# Patient Record
Sex: Female | Born: 1965
Health system: Southern US, Community
[De-identification: ages and names within clinical notes are randomized; demographics above are authoritative.]

## PROBLEM LIST (undated history)

## (undated) DIAGNOSIS — E669 Obesity, unspecified: Secondary | ICD-10-CM

## (undated) DIAGNOSIS — U071 COVID-19: Secondary | ICD-10-CM

## (undated) DIAGNOSIS — E7800 Pure hypercholesterolemia, unspecified: Secondary | ICD-10-CM

## (undated) DIAGNOSIS — E785 Hyperlipidemia, unspecified: Secondary | ICD-10-CM

## (undated) DIAGNOSIS — M549 Dorsalgia, unspecified: Secondary | ICD-10-CM

## (undated) DIAGNOSIS — O009 Unspecified ectopic pregnancy without intrauterine pregnancy: Secondary | ICD-10-CM

## (undated) DIAGNOSIS — K5900 Constipation, unspecified: Secondary | ICD-10-CM

## (undated) DIAGNOSIS — G8929 Other chronic pain: Secondary | ICD-10-CM

## (undated) DIAGNOSIS — E559 Vitamin D deficiency, unspecified: Principal | ICD-10-CM

## (undated) DIAGNOSIS — N7011 Chronic salpingitis: Secondary | ICD-10-CM

## (undated) DIAGNOSIS — R102 Pelvic and perineal pain: Secondary | ICD-10-CM

## (undated) DIAGNOSIS — D259 Leiomyoma of uterus, unspecified: Secondary | ICD-10-CM

## (undated) HISTORY — PX: WISDOM TOOTH EXTRACTION: SHX21

## (undated) HISTORY — PX: ABLATION: SHX5711

## (undated) HISTORY — DX: COVID-19: U07.1

## (undated) HISTORY — PX: APPENDECTOMY: SHX54

## (undated) HISTORY — DX: Vitamin D deficiency, unspecified: E55.9

## (undated) HISTORY — DX: Obesity, unspecified: E66.9

---

## 1996-03-25 HISTORY — PX: SALPINGECTOMY: SHX328

## 2011-10-16 ENCOUNTER — Encounter (HOSPITAL_COMMUNITY): Payer: Self-pay | Admitting: Emergency Medicine

## 2011-10-16 ENCOUNTER — Emergency Department (HOSPITAL_COMMUNITY)
Admission: EM | Admit: 2011-10-16 | Discharge: 2011-10-16 | Disposition: A | Discharge status: Home / Self Care | Payer: Self-pay | Attending: Emergency Medicine | Admitting: Emergency Medicine

## 2011-10-16 DIAGNOSIS — X500XXA Overexertion from strenuous movement or load, initial encounter: Secondary | ICD-10-CM | POA: Insufficient documentation

## 2011-10-16 DIAGNOSIS — S139XXA Sprain of joints and ligaments of unspecified parts of neck, initial encounter: Secondary | ICD-10-CM | POA: Insufficient documentation

## 2011-10-16 DIAGNOSIS — Y9269 Other specified industrial and construction area as the place of occurrence of the external cause: Secondary | ICD-10-CM | POA: Insufficient documentation

## 2011-10-16 DIAGNOSIS — IMO0002 Reserved for concepts with insufficient information to code with codable children: Secondary | ICD-10-CM

## 2011-10-16 HISTORY — DX: Hyperlipidemia, unspecified: E78.5

## 2011-10-16 HISTORY — DX: Leiomyoma of uterus, unspecified: D25.9

## 2011-10-16 MED ORDER — KETOROLAC TROMETHAMINE 60 MG/2ML IM SOLN
60.0000 mg | Freq: Once | INTRAMUSCULAR | Status: AC
  Administered 2011-10-16: 60 mg via INTRAMUSCULAR
  Filled 2011-10-16: qty 2

## 2011-10-16 MED ORDER — CYCLOBENZAPRINE HCL 10 MG PO TABS: 10.0000 mg | ORAL_TABLET | Freq: Two times a day (BID) | ORAL | Status: AC | PRN

## 2011-10-16 MED ORDER — IBUPROFEN 600 MG PO TABS: 600.0000 mg | ORAL_TABLET | Freq: Four times a day (QID) | ORAL | Status: AC | PRN

## 2011-10-16 NOTE — ED Notes (Signed)
Pt reports upper back and neck pain that increases with movement denies event or injury

## 2011-10-16 NOTE — ED Provider Notes (Signed)
History     CSN: [id number]  Arrival date & time 10/16/11  2952   First MD Initiated Contact with Patient 10/16/11 [phone]      Chief Complaint  Patient presents with  . Neck Pain  . Back Pain    (Consider location/radiation/quality/duration/timing/severity/associated sxs/prior treatment) HPI Comments: 46 y/o female here with sudden onset neck pain while driving yesterday. No known injury or trauma. States she works in [employer] and is constantly lifting heavy boxes. Describes pain as sharp, constant, and rates it 10/10. Pain worse with movement and tylenol provides no relief. She had a hard time getting comfortable to sleep last night. Denies any neck stiffness, fever, chills, rashes, numbness or tingling down arms, extremity weakness, headache or visual disturbance. She has never injured her neck before.  Patient is a 46 y.o. female presenting with neck pain and back pain. The history is provided by the patient.  Neck Pain  Pertinent negatives include no numbness and no headaches.  Back Pain  Pertinent negatives include no fever, no numbness and no headaches.    Past Medical History  Diagnosis Date  . Fibroid uterus   . Hyperlipemia     No past surgical history on file.  No family history on file.  History  Substance Use Topics  . Smoking status: Never Smoker   . Smokeless tobacco: Not on file  . Alcohol Use: No    OB History    Grav Para Term Preterm Abortions TAB SAB Ect Mult Living                  Review of Systems  Constitutional: Negative for fever and chills.  HENT: Positive for neck pain. Negative for neck stiffness.   Eyes: Negative for visual disturbance.  Musculoskeletal:       Neck pain  Skin: Negative for rash.  Neurological: Negative for numbness and headaches.    Allergies  Review of patient's allergies indicates no known allergies.  Home Medications   Current Outpatient Rx  Name Route Sig Dispense Refill  . ACETAMINOPHEN 325 MG PO TABS  Oral Take 650 mg by mouth every 6 (six) hours as needed. For pain    . CYCLOBENZAPRINE HCL 10 MG PO TABS Oral Take 1 tablet (10 mg total) by mouth 2 (two) times daily as needed for muscle spasms. 20 tablet 0  . IBUPROFEN 600 MG PO TABS Oral Take 1 tablet (600 mg total) by mouth every 6 (six) hours as needed for pain. 30 tablet 0    BP 156/84  Pulse 68  Temp 97.8 F (36.6 C) (Oral)  Resp 18  SpO2 99%  Physical Exam  Constitutional: She is oriented to person, place, and time. She appears well-developed and well-nourished.  HENT:  Head: Normocephalic and atraumatic.  Mouth/Throat: Oropharynx is clear and moist.  Eyes: Conjunctivae and EOM are normal. Pupils are equal, round, and reactive to light.  Neck: Muscular tenderness (left paraspinal muscles and trapezius tenderness) present. No spinous process tenderness present. No rigidity. Decreased range of motion (in all directions due to pain) present. No edema and no erythema present. No Brudzinski's sign and no Kernig's sign noted.  Cardiovascular: Normal rate, regular rhythm, normal heart sounds and intact distal pulses.   Pulmonary/Chest: Effort normal and breath sounds normal.  Musculoskeletal:       Cervical back: She exhibits decreased range of motion and tenderness (left paraspinal muscles and trapezius). She exhibits no bony tenderness, no edema and no deformity.  Neurological:  She is alert and oriented to person, place, and time. She has normal strength and normal reflexes. No sensory deficit.       DTR's UE intact and symmetric  Skin: No rash noted.  Psychiatric: She has a normal mood and affect. Her behavior is normal.    ED Course  Procedures (including critical care time)  Labs Reviewed - No data to display No results found.   1. Neck sprain and strain       MDM  46 y/o female with neck pain. Patient is afebrile with negative meningeal signs. No focal neurologic deficits present. Exam consistent with muscle  strain/sprain. Discharge with ibuprofen and flexeril, recommend ice/heat and rest. Patient comfortable with plan of care.  Trevor Mace, PA-C 10/16/11 [phone]

## 2011-10-16 NOTE — ED Provider Notes (Signed)
Medical screening examination/treatment/procedure(s) were performed by non-physician practitioner and as supervising physician I was immediately available for consultation/collaboration.  Vivi Piccirilli, MD 10/16/11 0723 

## 2011-10-17 ENCOUNTER — Ambulatory Visit (INDEPENDENT_AMBULATORY_CARE_PROVIDER_SITE_OTHER): Payer: Self-pay | Admitting: Obstetrics & Gynecology

## 2011-10-17 ENCOUNTER — Encounter: Payer: Self-pay | Admitting: Obstetrics & Gynecology

## 2011-10-17 VITALS — BP 128/83 | HR 60 | Temp 97.5°F | Ht 62.0 in | Wt 159.0 lb

## 2011-10-17 DIAGNOSIS — N920 Excessive and frequent menstruation with regular cycle: Secondary | ICD-10-CM | POA: Insufficient documentation

## 2011-10-17 DIAGNOSIS — D219 Benign neoplasm of connective and other soft tissue, unspecified: Secondary | ICD-10-CM

## 2011-10-17 DIAGNOSIS — D259 Leiomyoma of uterus, unspecified: Secondary | ICD-10-CM

## 2011-10-17 MED ORDER — NORGESTIMATE-ETH ESTRADIOL 0.25-35 MG-MCG PO TABS: 1.0000 | ORAL_TABLET | Freq: Every day | ORAL | Status: DC

## 2011-10-17 NOTE — Patient Instructions (Signed)
Fibroids You have been diagnosed as having a fibroid. Fibroids are smooth muscle lumps (tumors) which can occur any place in a woman's body. They are usually in the womb (uterus). The most common problem (symptom) of fibroids is bleeding. Over time this may cause low red blood cells (anemia). Other symptoms include feelings of pressure and pain in the pelvis. The diagnosis (learning what is wrong) of fibroids is made by physical exam. Sometimes tests such as an ultrasound are used. This is helpful when fibroids are felt around the ovaries and to look for tumors. TREATMENT   Most fibroids do not need surgical or medical treatment. Sometimes a tissue sample (biopsy) of the lining of the uterus is done to rule out cancer. If there is no cancer and only a small amount of bleeding, the problem can be watched.   Hormonal treatment can improve the problem.   When surgery is needed, it can consist of removing the fibroid. Vaginal birth may not be possible after the removal of fibroids. This depends on where they are and the extent of surgery. When pregnancy occurs with fibroids it is usually normal.   Your caregiver can help decide which treatments are best for you.  HOME CARE INSTRUCTIONS   Do not use aspirin as this may increase bleeding problems.   If your periods (menses) are heavy, record the number of pads or tampons used per month. Bring this information to your caregiver. This can help them determine the best treatment for you.  SEEK IMMEDIATE MEDICAL CARE IF:  You have pelvic pain or cramps not controlled with medications, or experience a sudden increase in pain.   You have an increase of pelvic bleeding between and during menses.   You feel lightheaded or have fainting spells.   You develop worsening belly (abdominal) pain.  Document Released: 03/08/2000 Document Revised: 02/28/2011 Document Reviewed: 10/29/2007 [facility] Patient Information 2012 [place], [place].Menorrhagia Dysfunctional  uterine bleeding is different from a normal menstrual period. When periods are heavy or there is more bleeding than is usual for you, it is called menorrhagia. It may be caused by hormonal imbalance, or physical, metabolic, or other problems. Examination is necessary in order that your caregiver may treat treatable causes. If this is a continuing problem, a D&C may be needed. That means that the cervix (the opening of the uterus or womb) is dilated (stretched larger) and the lining of the uterus is scraped out. The tissue scraped out is then examined under a microscope by a specialist (pathologist) to make sure there is nothing of concern that needs further or more extensive treatment. HOME CARE INSTRUCTIONS   If medications were prescribed, take exactly as directed. Do not change or switch medications without consulting your caregiver.   Long term heavy bleeding may result in iron deficiency. Your caregiver may have prescribed iron pills. They help replace the iron your body lost from heavy bleeding. Take exactly as directed. Iron may cause constipation. If this becomes a problem, increase the bran, fruits, and roughage in your diet.   Do not take aspirin or medicines that contain aspirin one week before or during your menstrual period. Aspirin may make the bleeding worse.   If you need to change your sanitary pad or tampon more than once every 2 hours, stay in bed and rest as much as possible until the bleeding stops.   Eat well-balanced meals. Eat foods high in iron. Examples are leafy green vegetables, meat, liver, eggs, and whole grain breads and  cereals. Do not try to lose weight until the abnormal bleeding has stopped and your blood iron level is back to normal.  SEEK MEDICAL CARE IF:   You need to change your sanitary pad or tampon more than once an hour.   You develop nausea (feeling sick to your stomach) and vomiting, dizziness, or diarrhea while you are taking your medicine.   You have  any problems that may be related to the medicine you are taking.  SEEK IMMEDIATE MEDICAL CARE IF:   You have a fever.   You develop chills.   You develop severe bleeding or start to pass blood clots.   You feel dizzy or faint.  MAKE SURE YOU:   Understand these instructions.   Will watch your condition.   Will get help right away if you are not doing well or get worse.  Document Released: 03/11/2005 Document Revised: 02/28/2011 Document Reviewed: 10/30/2007 [facility] Patient Information 2012 [place], [place].

## 2011-10-17 NOTE — Progress Notes (Signed)
Subjective:     Patient ID: Jill Vargas, female   DOB: 1965-12-29, 46 y.o.   MRN: [id number]  HPI [id number] LMP 10/09/11. Pt with h/o menorrhagia.  Treated when incarcerated with medicine that stopped her cycle for 9months.  Cycle has now returned heavy and prolonged and assocaited with large clots.  No pain noted.  Prev dx'd with uterine fibroids.  Pt reports that in the past year she had a Endo bx and a PA that were normal.  She has the results at home.  Not currently sexually active.   Review of Systems [place]     Objective:   Physical Exam GU: EGBUS: no lesions Vagina: + blood in vault Cervix: no lesion; no mucopurulent d/c Uterus:enlarged ~14 weeks sized, mobile with good decensus Adnexa: no masses         Assessment:   menorrhagia thought 2ndary to uterine fibroids.    Plan:     Sprintec 1 po q day F/u 3 months or sooner prn Pt reports that she will bring in all of her records later on today  Aliece Honold L. Harraway-Smith, M.D., [place]

## 2012-01-02 ENCOUNTER — Encounter (HOSPITAL_COMMUNITY): Payer: Self-pay | Admitting: Emergency Medicine

## 2012-01-02 ENCOUNTER — Emergency Department (HOSPITAL_COMMUNITY)
Admission: EM | Admit: 2012-01-02 | Discharge: 2012-01-02 | Disposition: A | Discharge status: Home / Self Care | Payer: Self-pay | Attending: Emergency Medicine | Admitting: Emergency Medicine

## 2012-01-02 DIAGNOSIS — D259 Leiomyoma of uterus, unspecified: Secondary | ICD-10-CM | POA: Insufficient documentation

## 2012-01-02 DIAGNOSIS — D219 Benign neoplasm of connective and other soft tissue, unspecified: Secondary | ICD-10-CM

## 2012-01-02 DIAGNOSIS — N920 Excessive and frequent menstruation with regular cycle: Secondary | ICD-10-CM | POA: Insufficient documentation

## 2012-01-02 LAB — WET PREP, GENITAL
Clue Cells Wet Prep HPF POC: NONE SEEN
Trich, Wet Prep: NONE SEEN
Yeast Wet Prep HPF POC: NONE SEEN

## 2012-01-02 LAB — URINALYSIS, ROUTINE W REFLEX MICROSCOPIC
Glucose, UA: NEGATIVE mg/dL
Protein, ur: 30 mg/dL — AB

## 2012-01-02 LAB — COMPREHENSIVE METABOLIC PANEL
AST: 13 U/L (ref 0–37)
BUN: 10 mg/dL (ref 6–23)
CO2: 25 mEq/L (ref 19–32)
Calcium: 8.8 mg/dL (ref 8.4–10.5)
Creatinine, Ser: 0.68 mg/dL (ref 0.50–1.10)
GFR calc non Af Amer: >90 mL/min (ref >90)

## 2012-01-02 LAB — CBC WITH DIFFERENTIAL/PLATELET
Basophils Absolute: 0 10*3/uL (ref 0.0–0.1)
Basophils Relative: 0 % (ref 0–1)
Eosinophils Relative: 2 % (ref 0–5)
HCT: 40.7 % (ref 36.0–46.0)
Lymphocytes Relative: 28 % (ref 12–46)
MCHC: 34.9 g/dL (ref 30.0–36.0)
MCV: 85.1 fL (ref 78.0–100.0)
Monocytes Absolute: 0.8 10*3/uL (ref 0.1–1.0)
Monocytes Relative: 10 % (ref 3–12)
RDW: 12.7 % (ref 11.5–15.5)

## 2012-01-02 LAB — URINE MICROSCOPIC-ADD ON

## 2012-01-02 LAB — LIPASE, BLOOD: Lipase: 56 U/L (ref 11–59)

## 2012-01-02 MED ORDER — IBUPROFEN 800 MG PO TABS
800.0000 mg | ORAL_TABLET | Freq: Once | ORAL | Status: AC
  Administered 2012-01-02: 800 mg via ORAL
  Filled 2012-01-02: qty 1

## 2012-01-02 MED ORDER — NAPROXEN 375 MG PO TABS: 375.0000 mg | ORAL_TABLET | Freq: Two times a day (BID) | ORAL | Status: DC

## 2012-01-02 NOTE — ED Provider Notes (Signed)
History     CSN: [id number]  Arrival date & time 01/02/12  1357   First MD Initiated Contact with Patient 01/02/12 1806      Chief Complaint  Patient presents with  . Vaginal Bleeding  . Abdominal Pain    (Consider location/radiation/quality/duration/timing/severity/associated sxs/prior treatment) HPI Comments: Patient is a 46 year old female, [id number] LMP 8/1 with a history of salpingectomy, uterine fibroids that presents emergency department with a chief complaint of menorrhagia and dysmenorrhea.  Patient states that she is currently taking birth control, but did not have an OB/GYN doctor that she follows with.  She reports onset of diffuse abdominal cramping for about one week, 2 days of white vaginal discharge and heavy bleeding beginning this morning. Pt has gone through 4 super pads since 7:00 AM this morning. Pt was seen by Willodean Rosenthal, MD in late July and advised to follow up. Pt has not seen her again. Pt denies fevers, night sweats chills, N/V/D, dizziness or syncope.   Patient is a 46 y.o. female presenting with vaginal bleeding and abdominal pain. The history is provided by the patient.  Vaginal Bleeding Associated symptoms include abdominal pain. Pertinent negatives include no congestion, coughing, diaphoresis, fever, headaches, myalgias, neck pain or rash.  Abdominal Pain The primary symptoms of the illness include abdominal pain, vaginal discharge and vaginal bleeding. The primary symptoms of the illness do not include fever or dysuria.  The vaginal discharge is not associated with dysuria.  Additional symptoms associated with the illness include back pain. Symptoms associated with the illness do not include diaphoresis, urgency, hematuria or frequency.    Past Medical History  Diagnosis Date  . Fibroid uterus   . Hyperlipemia     Past Surgical History  Procedure Date  . Salpingectomy 1998    Family History  Problem Relation Age of Onset  .  Hypertension Mother   . Diabetes Father     History  Substance Use Topics  . Smoking status: Former Games developer  . Smokeless tobacco: Never Used  . Alcohol Use: No    OB History    Grav Para Term Preterm Abortions TAB SAB Ect Mult Living   3 3 1 2  0 0 0 0 0 3      Review of Systems  Constitutional: Negative for fever, diaphoresis and activity change.  HENT: Negative for congestion and neck pain.   Respiratory: Negative for cough.   Gastrointestinal: Positive for abdominal pain.  Genitourinary: Positive for vaginal bleeding, vaginal discharge and menstrual problem. Negative for dysuria, urgency, frequency, hematuria, flank pain, decreased urine volume, enuresis, genital sores, vaginal pain and pelvic pain.  Musculoskeletal: Positive for back pain. Negative for myalgias.  Skin: Negative for color change, rash and wound.  Neurological: Negative for headaches.  All other systems reviewed and are negative.    Allergies  Review of patient's allergies indicates no known allergies.  Home Medications   Current Outpatient Rx  Name Route Sig Dispense Refill  . NORGESTIMATE-ETH ESTRADIOL 0.25-35 MG-MCG PO TABS Oral Take 1 tablet by mouth daily. 1 Package 11  . SIMVASTATIN 5 MG PO TABS Oral Take 5 mg by mouth at bedtime.      BP 136/77  Pulse 64  Temp 98.1 F (36.7 C) (Oral)  Resp 16  SpO2 99%  LMP 11/02/2011  Physical Exam  Nursing note and vitals reviewed. Constitutional: She is oriented to person, place, and time. She appears well-developed and well-nourished. No distress.  HENT:  Head: Normocephalic and atraumatic.  Eyes:  Conjunctivae normal and EOM are normal.  Neck: Normal range of motion.  Cardiovascular:       Regular rate rhythm, no aberrancy and auscultation.  Intact distal pulses.  Pulmonary/Chest: Effort normal.  Abdominal:       Soft abdomen with suprapubic tenderness.  Bowel sounds present.  No guarding, rebound, or peritoneal signs.  Genitourinary:        Exam performed by Jaci Carrel,  exam chaperoned Date: 01/02/2012 Pelvic exam: normal external genitalia without evidence of trauma. VAGINA: normal appearing vagina with normal color and discharge, no lesions. CERVIX: unable to visualize d/t heavy menstrayion; vaginal discharge - bloody, Wet prep and DNA probe for chlamydia and GC obtained.   ADNEXA: normal adnexa in size, nontender and no masses UTERUS: tender   Musculoskeletal: Normal range of motion.  Neurological: She is alert and oriented to person, place, and time.  Skin: Skin is warm and dry. No rash noted. She is not diaphoretic.  Psychiatric: She has a normal mood and affect. Her behavior is normal.    ED Course  Procedures (including critical care time)  Labs Reviewed  URINALYSIS, ROUTINE W REFLEX MICROSCOPIC - Abnormal; Notable for the following:    APPearance CLOUDY (*)     Specific Gravity, Urine 1.031 (*)     Hgb urine dipstick LARGE (*)     Ketones, ur TRACE (*)     Protein, ur 30 (*)     Leukocytes, UA SMALL (*)     All other components within normal limits  URINE MICROSCOPIC-ADD ON - Abnormal; Notable for the following:    Squamous Epithelial / LPF FEW (*)     Bacteria, UA FEW (*)     All other components within normal limits  PREGNANCY, URINE  CBC WITH DIFFERENTIAL  COMPREHENSIVE METABOLIC PANEL  LIPASE, BLOOD   No results found.   No diagnosis found.    MDM  46 year old female presents emergency department complaining of menorrhagia and dysmenorrhea.  Labs and imaging reviewed.  Hemoglobin currently stable.  Patient has a past diagnosis of uterine fibroids by OB/GYN.  This is the likely etiology of current symptoms. Discussed diagnosis with patient. She has been advised to followup with her OB/GYN for further evaluation and possible change of treatment if birth control is not helping symptoms.  Patient will be discharged with pain medication and she has been advised to call her doctor tomorrow  morning.        Jaci Carrel, [place] 01/02/12 2022

## 2012-01-02 NOTE — ED Provider Notes (Signed)
Medical screening examination/treatment/procedure(s) were performed by non-physician practitioner and as supervising physician I was immediately available for consultation/collaboration.    Jakaylee Sasaki L Saria Haran, MD 01/02/12 2046 

## 2012-01-02 NOTE — ED Notes (Signed)
Pt presenting to ed with c/o vaginal bleeding this morning pt states blood was black pt states she has not had a period in 2 months. Pt states she saw a piece of "meat" come out when she presented to work this morning. Pt states light bleeding at this morning. Pt denies nausea and vomiting at this time

## 2012-01-03 LAB — GC/CHLAMYDIA PROBE AMP, GENITAL: GC Probe Amp, Genital: NEGATIVE

## 2012-01-03 LAB — HIV ANTIBODY (ROUTINE TESTING W REFLEX): HIV: NONREACTIVE

## 2012-01-27 ENCOUNTER — Other Ambulatory Visit (HOSPITAL_COMMUNITY)
Admission: RE | Admit: 2012-01-27 | Discharge: 2012-01-27 | Disposition: A | Discharge status: Home / Self Care | Payer: Medicaid Other | Source: Ambulatory Visit | Attending: Obstetrics & Gynecology | Admitting: Obstetrics & Gynecology

## 2012-01-27 ENCOUNTER — Ambulatory Visit (INDEPENDENT_AMBULATORY_CARE_PROVIDER_SITE_OTHER): Payer: Medicaid Other | Admitting: Obstetrics & Gynecology

## 2012-01-27 ENCOUNTER — Encounter: Payer: Self-pay | Admitting: Obstetrics & Gynecology

## 2012-01-27 VITALS — BP 122/78 | HR 64 | Temp 97.7°F | Ht 62.0 in | Wt 163.0 lb

## 2012-01-27 DIAGNOSIS — Z1151 Encounter for screening for human papillomavirus (HPV): Secondary | ICD-10-CM | POA: Insufficient documentation

## 2012-01-27 DIAGNOSIS — D259 Leiomyoma of uterus, unspecified: Secondary | ICD-10-CM

## 2012-01-27 DIAGNOSIS — D219 Benign neoplasm of connective and other soft tissue, unspecified: Secondary | ICD-10-CM

## 2012-01-27 DIAGNOSIS — Z01419 Encounter for gynecological examination (general) (routine) without abnormal findings: Secondary | ICD-10-CM | POA: Insufficient documentation

## 2012-01-27 DIAGNOSIS — N920 Excessive and frequent menstruation with regular cycle: Secondary | ICD-10-CM | POA: Insufficient documentation

## 2012-01-27 LAB — POCT PREGNANCY, URINE: Preg Test, Ur: NEGATIVE

## 2012-01-27 NOTE — Progress Notes (Signed)
Patient ID: Jill Vargas, female   DOB: 12/08/1965, 46 y.o.   MRN: [id number] S/ Pt with a h/o menorrhagia.  Seen in July and started on OCP's.  Pt c/o heavy bleeding requiring a ER visit 2 weeks ago.  Pt presents today for an Endobx.  No bleeding since ER visit.  O/ Pt in NAD. BP 122/78  Pulse 64  Temp 97.7 F (36.5 C) (Oral)  Ht 5\' 2"  (1.575 m)  Wt 163 lb (73.936 kg)  BMI 29.81 kg/m2  LMP 01/09/2012  Procdure: The indications for endometrial biopsy were reviewed.   Risks of the biopsy including cramping, bleeding, infection, uterine perforation, inadequate specimen and need for additional procedures  were discussed. The patient states she understands and agrees to undergo procedure today. Consent was signed. Time out was performed. Urine HCG was negative. A sterile speculum was placed in the patient's vagina and the cervix was prepped with Betadine. A single-toothed tenaculum was placed on the anterior lip of the cervix to stabilize it. The 3 mm pipelle was introduced into the endometrial cavity without difficulty to a depth of 10cm, and a moderate amount of tissue was obtained and sent to pathology. The instruments were removed from the patient's vagina. Minimal bleeding from the cervix was noted. The patient tolerated the procedure well. Routine post-procedure instructions were given to the patient.  A/P  menorrhagia suspect due to uterine fibroids. Pelvic sono Pt to f/u in 2 weeks to review Endobx and sono results and discuss further management  Pruitt Taboada L. Harraway-Smith, M.D., [place]

## 2012-01-27 NOTE — Patient Instructions (Addendum)
Uterine Fibroid  A uterine fibroid is a growth (tumor) that occurs in a woman's uterus. This type of tumor is not cancerous and does not spread out of the uterus. A woman can have one or many fibroids, and the fiboid(s) can become quite large. A fibroid can vary in size, weight, and where it grows in the uterus. Most fibroids do not require medical treatment, but some can cause pain or heavy bleeding during and between periods.  CAUSES   A fibroid is the result of a single uterine cell that keeps growing (unregulated), which is different than most cells in the human body. Most cells have a control mechanism that keeps them from reproducing without control.   SYMPTOMS    Bleeding.   Pelvic pain and pressure.   Bladder problems due to the size of the fibroid.   Infertility and miscarriages depending on the size and location of the fibroid.  DIAGNOSIS   A diagnosis is made by physical exam. Your caregiver may feel the lumpy tumors during a pelvic exam. Important information regarding size, location, and number of tumors can be gained by having an ultrasound. It is rare that other tests, such as a CT scan or MRI, are needed.  TREATMENT    Your caregiver may recommend watchful waiting. This involves getting the fibroid checked by your caregiver to see if the fibroids grow or shrink.    Hormonal treatment or an intrauterine device (IUD) may be prescribed.    Surgery may be needed to remove the fibroids (myomectomy) or the uterus (hysterectomy). This depends on your situation.  When fibroids interfere with fertility and a woman wants to become pregnant, a caregiver may recommend having the fibroids removed.   HOME CARE INSTRUCTIONS   Home care depends on how you were treated. In general:    Keep all follow-up appointments with your caregiver.    Only take medicine as told by your caregiver. Do not take aspirin. It can cause bleeding.    If you have excessive periods and soak tampons or pads in a half hour or  less, contact your caregiver immediately. If your periods are troublesome but not so heavy, lie down with your feet raised slightly above your heart. Place cold packs on your lower abdomen.    If your periods are heavy, write down the number of pads or tampons you use per month. Bring this information to your caregiver.    Talk to your caregiver about taking iron pills.    Include green vegetables in your diet.    If you were prescribed a hormonal treatment, take the hormonal medicines as directed.    If you need surgery, ask your caregiver for information on your specific surgery.   SEEK IMMEDIATE MEDICAL CARE IF:   You have pelvic pain or cramps not controlled with medicines.    You have a sudden increase in pelvic pain.    You have an increase of bleeding between and during periods.    You feel lightheaded or have fainting episodes.   MAKE SURE YOU:   Understand these instructions.   Will watch your condition.   Will get help right away if you are not doing well or get worse.  Document Released: 03/08/2000 Document Revised: 06/03/2011 Document Reviewed: 04/01/2011  [employer] Patient Information 2013 [employer].

## 2012-01-27 NOTE — Addendum Note (Signed)
Addended by: Freddi Starr on: 01/27/2012 03:56 PM   Modules accepted: Orders

## 2012-01-29 ENCOUNTER — Ambulatory Visit (HOSPITAL_COMMUNITY)
Admission: RE | Admit: 2012-01-29 | Discharge: 2012-01-29 | Disposition: A | Discharge status: Home / Self Care | Payer: Medicaid Other | Source: Ambulatory Visit | Attending: Obstetrics & Gynecology | Admitting: Obstetrics & Gynecology

## 2012-01-29 DIAGNOSIS — D219 Benign neoplasm of connective and other soft tissue, unspecified: Secondary | ICD-10-CM

## 2012-01-29 DIAGNOSIS — D259 Leiomyoma of uterus, unspecified: Secondary | ICD-10-CM | POA: Insufficient documentation

## 2012-01-29 DIAGNOSIS — N920 Excessive and frequent menstruation with regular cycle: Secondary | ICD-10-CM

## 2012-02-12 ENCOUNTER — Ambulatory Visit (INDEPENDENT_AMBULATORY_CARE_PROVIDER_SITE_OTHER): Payer: Medicaid Other | Admitting: Obstetrics & Gynecology

## 2012-02-12 ENCOUNTER — Encounter: Payer: Self-pay | Admitting: Obstetrics & Gynecology

## 2012-02-12 VITALS — BP 128/78 | HR 74 | Temp 97.0°F | Resp 12 | Ht 62.0 in | Wt 162.7 lb

## 2012-02-12 DIAGNOSIS — N920 Excessive and frequent menstruation with regular cycle: Secondary | ICD-10-CM

## 2012-02-12 NOTE — Progress Notes (Signed)
Subjective:     Patient ID: Jill Vargas, female   DOB: 1966/03/14, 46 y.o.   MRN: [id number]  HPI Pt with h/o menorrhagia.  She continues to c/o heavy bleeding during her withdrawal week.  She bleeds ~7-8 days and reports that her cycle is improved but, is still very heavy.  Pt wants further treatment.     Review of [place]     Objective:   Physical Exam BP 128/78  Pulse 74  Temp 97 F (36.1 C) (Oral)  Resp 12  Ht 5\' 2"  (1.575 m)  Wt 162 lb 11.2 oz (73.8 kg)  BMI 29.76 kg/m2  LMP 02/05/2012  deferred  01/27/12 Endometrium, biopsy - BENIGN SECRETORY ENDOMETRIUM, NO ATYPIA, HYPERPLASIA OR MALIGNANCY.  01/27/12 PAP: WNL neg HR HPV  01/28/12  Pelvic sono: Uterus: 8.9 x 5.8 x 4.8 cm. Anteverted, anteflexed. No focal  abnormality.  Endometrium: 4 mm. Uniformly thin and echogenic.  Right ovary: 2.1 x 2.2 x 1.1 cm. Normal.  Left ovary: 1.7 x 1.4 x 1.2 cm. Normal.  Other findings: Trace free fluid noted in the cul-de-sac.  IMPRESSION:  Normal study. No evidence of pelvic mass or other significant  abnormality.     Assessment:     Menorrhagia-  D/w pt treatment options.  Pt would like to proceed with endometrial ablation    Plan:     Will schedule hysteroscopy with endometrial ablation using Novasure.  I have reviewed with pt the risks of the procedure as well as the alternatives.  She wishes to proceed.  Lanell Carpenter L. Harraway-Smith, M.D., [place]

## 2012-02-12 NOTE — Patient Instructions (Signed)

## 2012-03-27 ENCOUNTER — Encounter (HOSPITAL_COMMUNITY): Payer: Self-pay | Admitting: Pharmacist

## 2012-03-30 ENCOUNTER — Encounter (HOSPITAL_COMMUNITY): Payer: Self-pay | Admitting: *Deleted

## 2012-04-06 ENCOUNTER — Encounter (HOSPITAL_COMMUNITY): Payer: Self-pay | Admitting: *Deleted

## 2012-04-06 ENCOUNTER — Encounter (HOSPITAL_COMMUNITY): Payer: Self-pay | Admitting: Anesthesiology

## 2012-04-06 ENCOUNTER — Encounter (HOSPITAL_COMMUNITY): Payer: Self-pay | Admitting: Obstetrics & Gynecology

## 2012-04-06 ENCOUNTER — Ambulatory Visit (HOSPITAL_COMMUNITY)
Admission: RE | Admit: 2012-04-06 | Discharge: 2012-04-06 | Disposition: A | Discharge status: Home / Self Care | Payer: No Typology Code available for payment source | Source: Ambulatory Visit | Attending: Obstetrics & Gynecology | Admitting: Obstetrics & Gynecology

## 2012-04-06 ENCOUNTER — Ambulatory Visit (HOSPITAL_COMMUNITY): Payer: No Typology Code available for payment source | Admitting: Anesthesiology

## 2012-04-06 ENCOUNTER — Encounter (HOSPITAL_COMMUNITY)
Admission: RE | Disposition: A | Discharge status: Home / Self Care | Payer: Self-pay | Source: Ambulatory Visit | Attending: Obstetrics & Gynecology

## 2012-04-06 DIAGNOSIS — D259 Leiomyoma of uterus, unspecified: Secondary | ICD-10-CM

## 2012-04-06 DIAGNOSIS — N920 Excessive and frequent menstruation with regular cycle: Secondary | ICD-10-CM | POA: Insufficient documentation

## 2012-04-06 DIAGNOSIS — D219 Benign neoplasm of connective and other soft tissue, unspecified: Secondary | ICD-10-CM

## 2012-04-06 HISTORY — PX: DILITATION & CURRETTAGE/HYSTROSCOPY WITH NOVASURE ABLATION: SHX5568

## 2012-04-06 LAB — CBC
HCT: 38.9 % (ref 36.0–46.0)
Hemoglobin: 13.4 g/dL (ref 12.0–15.0)
MCH: 29.9 pg (ref 26.0–34.0)
RBC: 4.48 MIL/uL (ref 3.87–5.11)

## 2012-04-06 LAB — PREGNANCY, URINE: Preg Test, Ur: NEGATIVE

## 2012-04-06 SURGERY — DILATATION & CURETTAGE/HYSTEROSCOPY WITH NOVASURE ABLATION
Anesthesia: General | Wound class: Clean Contaminated

## 2012-04-06 MED ORDER — LACTATED RINGERS IV SOLN: INTRAVENOUS | Status: DC

## 2012-04-06 MED ORDER — PROMETHAZINE HCL 25 MG/ML IJ SOLN: 6.2500 mg | INTRAMUSCULAR | Status: DC | PRN

## 2012-04-06 MED ORDER — PROPOFOL 10 MG/ML IV EMUL
INTRAVENOUS | Status: DC | PRN
  Administered 2012-04-06: 200 mg via INTRAVENOUS

## 2012-04-06 MED ORDER — ONDANSETRON HCL 4 MG/2ML IJ SOLN
INTRAMUSCULAR | Status: DC | PRN
  Administered 2012-04-06: 4 mg via INTRAVENOUS

## 2012-04-06 MED ORDER — LIDOCAINE HCL (CARDIAC) 20 MG/ML IV SOLN
INTRAVENOUS | Status: AC
  Filled 2012-04-06: qty 5

## 2012-04-06 MED ORDER — MIDAZOLAM HCL 2 MG/2ML IJ SOLN
INTRAMUSCULAR | Status: AC
  Filled 2012-04-06: qty 2

## 2012-04-06 MED ORDER — MIDAZOLAM HCL 2 MG/2ML IJ SOLN: 0.5000 mg | Freq: Once | INTRAMUSCULAR | Status: DC | PRN

## 2012-04-06 MED ORDER — IBUPROFEN 600 MG PO TABS: 600.0000 mg | ORAL_TABLET | Freq: Four times a day (QID) | ORAL | Status: DC | PRN

## 2012-04-06 MED ORDER — BUPIVACAINE HCL (PF) 0.5 % IJ SOLN
INTRAMUSCULAR | Status: AC
  Filled 2012-04-06: qty 30

## 2012-04-06 MED ORDER — MEPERIDINE HCL 25 MG/ML IJ SOLN: 6.2500 mg | INTRAMUSCULAR | Status: DC | PRN

## 2012-04-06 MED ORDER — FENTANYL CITRATE 0.05 MG/ML IJ SOLN: 25.0000 ug | INTRAMUSCULAR | Status: DC | PRN

## 2012-04-06 MED ORDER — FENTANYL CITRATE 0.05 MG/ML IJ SOLN
INTRAMUSCULAR | Status: AC
  Filled 2012-04-06: qty 2

## 2012-04-06 MED ORDER — KETOROLAC TROMETHAMINE 30 MG/ML IJ SOLN
INTRAMUSCULAR | Status: DC | PRN
  Administered 2012-04-06: 30 mg via INTRAVENOUS

## 2012-04-06 MED ORDER — MIDAZOLAM HCL 5 MG/5ML IJ SOLN
INTRAMUSCULAR | Status: DC | PRN
  Administered 2012-04-06: 2 mg via INTRAVENOUS

## 2012-04-06 MED ORDER — ONDANSETRON HCL 4 MG/2ML IJ SOLN
INTRAMUSCULAR | Status: AC
  Filled 2012-04-06: qty 2

## 2012-04-06 MED ORDER — FENTANYL CITRATE 0.05 MG/ML IJ SOLN
INTRAMUSCULAR | Status: DC | PRN
  Administered 2012-04-06: 100 ug via INTRAVENOUS

## 2012-04-06 MED ORDER — PROPOFOL 10 MG/ML IV EMUL
INTRAVENOUS | Status: AC
  Filled 2012-04-06: qty 20

## 2012-04-06 MED ORDER — LIDOCAINE HCL (CARDIAC) 20 MG/ML IV SOLN
INTRAVENOUS | Status: DC | PRN
  Administered 2012-04-06: 50 mg via INTRAVENOUS

## 2012-04-06 MED ORDER — KETOROLAC TROMETHAMINE 30 MG/ML IJ SOLN
INTRAMUSCULAR | Status: AC
  Filled 2012-04-06: qty 1

## 2012-04-06 MED ORDER — BUPIVACAINE HCL (PF) 0.5 % IJ SOLN
INTRAMUSCULAR | Status: DC | PRN
  Administered 2012-04-06: 10 mL

## 2012-04-06 MED ORDER — LACTATED RINGERS IV SOLN
INTRAVENOUS | Status: DC
Start: 2012-04-06 — End: 2012-04-06
  Administered 2012-04-06: 12:00:00 via INTRAVENOUS
  Administered 2012-04-06: 125 mL/h via INTRAVENOUS

## 2012-04-06 MED ORDER — KETOROLAC TROMETHAMINE 30 MG/ML IJ SOLN: 15.0000 mg | Freq: Once | INTRAMUSCULAR | Status: DC | PRN

## 2012-04-06 MED ORDER — LACTATED RINGERS IV SOLN
INTRAVENOUS | Status: DC | PRN
  Administered 2012-04-06: 3000 mL via INTRAUTERINE

## 2012-04-06 SURGICAL SUPPLY — 14 items
ABLATOR ENDOMETRIAL BIPOLAR (ABLATOR) ×2 IMPLANT
CATH ROBINSON RED A/P 16FR (CATHETERS) ×2 IMPLANT
CLOTH BEACON ORANGE TIMEOUT ST (SAFETY) ×2 IMPLANT
CONTAINER PREFILL 10% NBF 60ML (FORM) ×2 IMPLANT
DRESSING TELFA 8X3 (GAUZE/BANDAGES/DRESSINGS) ×2 IMPLANT
GLOVE BIO SURGEON STRL SZ7 (GLOVE) ×2 IMPLANT
GLOVE BIOGEL PI IND STRL 7.0 (GLOVE) ×2 IMPLANT
GLOVE BIOGEL PI INDICATOR 7.0 (GLOVE) ×2
GLOVE SURG SS PI 7.0 STRL IVOR (GLOVE) ×2 IMPLANT
GOWN STRL REIN XL XLG (GOWN DISPOSABLE) ×4 IMPLANT
PACK HYSTEROSCOPY LF (CUSTOM PROCEDURE TRAY) ×2 IMPLANT
PAD OB MATERNITY 4.3X12.25 (PERSONAL CARE ITEMS) ×2 IMPLANT
TOWEL OR 17X24 6PK STRL BLUE (TOWEL DISPOSABLE) ×4 IMPLANT
WATER STERILE IRR 1000ML POUR (IV SOLUTION) ×2 IMPLANT

## 2012-04-06 NOTE — Transfer of Care (Signed)
Immediate Anesthesia Transfer of Care Note  Patient: Jill Vargas  Procedure(s) Performed: Procedure(s) (LRB) with comments: DILATATION & CURETTAGE/HYSTEROSCOPY WITH NOVASURE ABLATION (N/A)  Patient Location: PACU  Anesthesia Type:General  Level of Consciousness: awake, alert  and oriented  Airway & Oxygen Therapy: Patient Spontanous Breathing and Patient connected to nasal cannula oxygen  Post-op Assessment: Report given to PACU RN and Post -op Vital signs reviewed and stable  Post vital signs: Reviewed and stable  Complications: No apparent anesthesia complications

## 2012-04-06 NOTE — Brief Op Note (Signed)
04/06/2012  11:03 AM  PATIENT:  Jill Vargas  47 y.o. female  PRE-OPERATIVE DIAGNOSIS:  History of menorrhagia  POST-OPERATIVE DIAGNOSIS:  menorrhagia  PROCEDURE:  Procedure(s) (LRB) with comments: DILATATION & CURETTAGE/HYSTEROSCOPY WITH NOVASURE ABLATION (N/A)  SURGEON:  Surgeon(s) and Role:    * Willodean Rosenthal, MD - Primary  ANESTHESIA:   general  EBL:  Total I/O In: 1000 [I.V.:1000] Out: 60 [Urine:50; Blood:10]  BLOOD ADMINISTERED:none  DRAINS: none   LOCAL MEDICATIONS USED:  MARCAINE     SPECIMEN:  Source of Specimen:  endometrium  DISPOSITION OF SPECIMEN:  PATHOLOGY  COUNTS:  YES  TOURNIQUET:  * No tourniquets in log *  DICTATION: .Note written in EPIC  PLAN OF CARE: Discharge to home after PACU  PATIENT DISPOSITION:  PACU - hemodynamically stable.   Delay start of Pharmacological VTE agent (>24hrs) due to surgical blood loss or risk of bleeding: not applicable

## 2012-04-06 NOTE — H&P (Signed)
  Pt is a 46yo G3 P3 with h/o menorrhagia for Endometrial ablation.  No new complaints  Past Medical History   Diagnosis  Date   .  Fibroid uterus    .  Hyperlipemia     Past Surgical History   Procedure  Date   .  Salpingectomy  1998    Family History   Problem  Relation  Age of Onset   .  Hypertension  Mother    .  Diabetes  Father     History   Substance Use Topics   .  Smoking status:  Former Games developer   .  Smokeless tobacco:  Never Used   .  Alcohol Use:  No    OB History    Grav  Para  Term  Preterm  Abortions  TAB  SAB  Ect  Mult  Living    3  3  1  2   0  0  0  0  0  3     Review of Systems  All other systems reviewed and are negative.  Allergies   Review of patient's allergies indicates no known allergies.  Home Medications    Current Outpatient Rx   Name  Route  Sig  Dispense  Refill   .  NORGESTIMATE-ETH ESTRADIOL 0.25-35 MG-MCG PO TABS  Oral  Take 1 tablet by mouth daily.  1 Package  11   .  SIMVASTATIN 5 MG PO TABS  Oral  Take 5 mg by mouth at bedtime.     BP 136/77  Pulse 64  Temp 98.1 F (36.7 C) (Oral)  Resp 16  SpO2 99%  LMP 11/02/2011  Physical Exam  Ht 5\' 2"  (1.575 m)  Wt 160 lb (72.576 kg)  BMI 29.26 kg/m2  LMP 03/30/2012  HENT: Negative for congestion and neck pain.  Respiratory: CTA CV: RRR.  Skin: Negative for color change, rash and wound.  Neurological: She is alert and oriented to person, place, and time.  Skin: Skin is warm and dry. No rash noted. She is not diaphoretic.  Psychiatric: She has a normal mood and affect. Her behavior is normal.   A/P Menorrhagia d/w pt again the risks and alternatives will proceed with hysteroscopy and endometrial ablation.  Informed consent obtained.  Will proceed.  Tarig Zimmers L. Harraway-Smith, M.D., [facility]

## 2012-04-06 NOTE — Anesthesia Postprocedure Evaluation (Signed)
Anesthesia Post Note  Patient: Jill Vargas  Procedure(s) Performed: Procedure(s) (LRB): DILATATION & CURETTAGE/HYSTEROSCOPY WITH NOVASURE ABLATION (N/A)  Anesthesia type: GA  Patient location: PACU  Post pain: Pain level controlled  Post assessment: Post-op Vital signs reviewed  Last Vitals:  Filed Vitals:   04/06/12 1200  BP: 114/59  Pulse: 70  Temp:   Resp: 17    Post vital signs: Reviewed  Level of consciousness: sedated  Complications: No apparent anesthesia complications

## 2012-04-06 NOTE — Op Note (Signed)
PRE-OPERATIVE DIAGNOSIS: Dysfunctional Uterine Bleeding  POST-OPERATIVE DIAGNOSIS:Dysfunctional Uterine Bleeding  PROCEDURE: Procedure(s): Hysteroscopy with dilation and curettage and  NOVASURE ENDOMETRIAL ABLATION  SURGEON: Willodean Rosenthal, M.D. ASSISTANTS: none  ANESTHESIA: Genreal with LMA / Paracervical block with 1% Lidocaine with epi IVF 1000cc EBL: <5cc  SPECIMEN: endometrial currettings  PLAN OF CARE: Discharge to home after PACU  PATIENT DISPOSITION: PACU - hemodynamically stable.   The risks, benefits, and alternatives of surgery were explained, understood, and accepted. The consents were signed and all questions were answered. She was taken to the operating room and general anesthesia was applied without complication. She was placed in the dorsal lithotomy position and her vagina and abdomen were prepped and draped in the usual sterile fashion. A bimanual exam revealed a normal size and shape anteverted mobile uterus. Her adnexa were non-enlarged.   A bivalved speculum was placed in the patients' vagina and the anterior lip of the cervix was grasped with a single toothed tenaculum. A paracervical block was performed at 5 and 7 o'clock with 10cc of 0.5% Marcaine.   The endometrial cavity was sounded to 10.0 cm and the endocervical length measured 2.5cm. A hysteroscope was inserted and the endometrium was noted to be slightly thickened.  There was a submucosal fibroid noted in the ant uterine wall.  The ostia on both sides were noted.  The scope was removed and a sharp currete was used to scape the lining of the uterus until a gritty texture was noted throughout.  Specimens were sent to pathology.  The NovaSure device was then inserted and seated using 6.0cm as the cavity length and 4.5cm as the cavity width.  The total activation time was 85 sec at a power of 149.  The hysteroscope was reinserted and an even burn pattern was noted to the fundus.  The single toothed tenaculum was  removed at the end of the case and no bleeding was noted from the cervix.   The patient was extubated and taken to the recovery room in stable condition.  Sponge, lap and instrument counts were correct.  There were no complications. Greg Eckrich L. Harraway-Smith, M.D., [facility] L. Harraway-Smith, M.D., [facility]

## 2012-04-06 NOTE — Anesthesia Preprocedure Evaluation (Signed)
Anesthesia Evaluation  Patient identified by MRN, date of birth, ID band Patient awake    Reviewed: Allergy & Precautions, H&P , Patient's Chart, lab work & pertinent test results, reviewed documented beta blocker date and time   History of Anesthesia Complications Negative for: history of anesthetic complications  Airway Mallampati: II TM Distance: >3 FB Neck ROM: full    Dental No notable dental hx.    Pulmonary neg pulmonary ROS,  breath sounds clear to auscultation  Pulmonary exam normal       Cardiovascular Exercise Tolerance: Good negative cardio ROS  Rhythm:regular Rate:Normal     Neuro/Psych negative neurological ROS  negative psych ROS   GI/Hepatic negative GI ROS, Neg liver ROS,   Endo/Other  negative endocrine ROS  Renal/GU negative Renal ROS     Musculoskeletal   Abdominal   Peds  Hematology negative hematology ROS (+)   Anesthesia Other Findings   Reproductive/Obstetrics negative OB ROS                           Anesthesia Physical Anesthesia Plan  ASA: II  Anesthesia Plan: General LMA   Post-op Pain Management:    Induction:   Airway Management Planned:   Additional Equipment:   Intra-op Plan:   Post-operative Plan:   Informed Consent: I have reviewed the patients History and Physical, chart, labs and discussed the procedure including the risks, benefits and alternatives for the proposed anesthesia with the patient or authorized representative who has indicated his/her understanding and acceptance.   Dental Advisory Given  Plan Discussed with: CRNA, Surgeon and Anesthesiologist  Anesthesia Plan Comments:         Anesthesia Quick Evaluation

## 2012-04-07 ENCOUNTER — Encounter (HOSPITAL_COMMUNITY): Payer: Self-pay | Admitting: Obstetrics & Gynecology

## 2012-04-16 ENCOUNTER — Other Ambulatory Visit (INDEPENDENT_AMBULATORY_CARE_PROVIDER_SITE_OTHER): Payer: No Typology Code available for payment source | Admitting: General Practice

## 2012-04-16 DIAGNOSIS — R350 Frequency of micturition: Secondary | ICD-10-CM

## 2012-04-16 LAB — POCT URINALYSIS DIP (DEVICE)
Bilirubin Urine: NEGATIVE
Glucose, UA: NEGATIVE mg/dL
Leukocytes, UA: NEGATIVE
Nitrite: NEGATIVE
Urobilinogen, UA: 0.2 mg/dL (ref 0.0–1.0)

## 2012-04-16 NOTE — Progress Notes (Signed)
Patient called and stated she was urinating a lot and when she urinated "tissue came out" and seemed concerned if this was related to her surgery from 2 weeks ago or not. Also stated she was having a lot of abdominal pain earlier but thinks this was related to constipation. Told patient she could come into the clinic and give [place] a urine sample to check for a UTI, patient agreed and was satisfied. Patient came in and gave a urine sample but left before I could discuss anything with her.

## 2012-04-17 ENCOUNTER — Other Ambulatory Visit: Payer: Self-pay | Admitting: Obstetrics & Gynecology

## 2012-04-17 ENCOUNTER — Inpatient Hospital Stay (HOSPITAL_COMMUNITY)
Admission: AD | Admit: 2012-04-17 | Discharge: 2012-04-17 | Disposition: A | Discharge status: Home / Self Care | Payer: No Typology Code available for payment source | Source: Ambulatory Visit | Attending: Obstetrics & Gynecology | Admitting: Obstetrics & Gynecology

## 2012-04-17 DIAGNOSIS — B9689 Other specified bacterial agents as the cause of diseases classified elsewhere: Secondary | ICD-10-CM

## 2012-04-17 MED ORDER — METRONIDAZOLE 500 MG PO TABS: 500.0000 mg | ORAL_TABLET | Freq: Two times a day (BID) | ORAL | Status: AC

## 2012-04-17 NOTE — MAU Note (Signed)
Per [employer], RN, pt stated office called and forgot to give pt rx, is going to office to get prescription, does not need to be seen/evaluated by MAU. Pt left after telling RN this information.

## 2012-06-19 ENCOUNTER — Inpatient Hospital Stay (HOSPITAL_COMMUNITY)
Admission: AD | Admit: 2012-06-19 | Discharge: 2012-06-19 | Disposition: A | Discharge status: Home / Self Care | Payer: No Typology Code available for payment source | Source: Ambulatory Visit | Attending: Obstetrics & Gynecology | Admitting: Obstetrics & Gynecology

## 2012-06-19 ENCOUNTER — Encounter (HOSPITAL_COMMUNITY): Payer: Self-pay | Admitting: *Deleted

## 2012-06-19 DIAGNOSIS — K5900 Constipation, unspecified: Secondary | ICD-10-CM | POA: Insufficient documentation

## 2012-06-19 DIAGNOSIS — M549 Dorsalgia, unspecified: Secondary | ICD-10-CM | POA: Insufficient documentation

## 2012-06-19 DIAGNOSIS — R109 Unspecified abdominal pain: Secondary | ICD-10-CM

## 2012-06-19 HISTORY — DX: Constipation, unspecified: K59.00

## 2012-06-19 LAB — WET PREP, GENITAL
Clue Cells Wet Prep HPF POC: NONE SEEN
Yeast Wet Prep HPF POC: NONE SEEN

## 2012-06-19 LAB — URINALYSIS, ROUTINE W REFLEX MICROSCOPIC
Leukocytes, UA: NEGATIVE
Nitrite: NEGATIVE
Protein, ur: NEGATIVE mg/dL
Urobilinogen, UA: 0.2 mg/dL (ref 0.0–1.0)

## 2012-06-19 LAB — CBC
MCV: 84.2 fL (ref 78.0–100.0)
Platelets: 237 10*3/uL (ref 150–400)
RDW: 12.8 % (ref 11.5–15.5)
WBC: 5.8 10*3/uL (ref 4.0–10.5)

## 2012-06-19 LAB — COMPREHENSIVE METABOLIC PANEL
ALT: 39 U/L — ABNORMAL HIGH (ref 0–35)
Alkaline Phosphatase: 82 U/L (ref 39–117)
CO2: 26 mEq/L (ref 19–32)
Calcium: 9.5 mg/dL (ref 8.4–10.5)
GFR calc Af Amer: >90 mL/min (ref >90)
GFR calc non Af Amer: >90 mL/min (ref >90)
Glucose, Bld: 104 mg/dL — ABNORMAL HIGH (ref 70–99)
Sodium: 141 mEq/L (ref 135–145)

## 2012-06-19 MED ORDER — KETOROLAC TROMETHAMINE 60 MG/2ML IM SOLN
60.0000 mg | Freq: Once | INTRAMUSCULAR | Status: AC
  Administered 2012-06-19: 60 mg via INTRAMUSCULAR
  Filled 2012-06-19: qty 2

## 2012-06-19 MED ORDER — POLYETHYLENE GLYCOL 3350 17 GM/SCOOP PO POWD: 17.0000 g | Freq: Every day | ORAL | Status: DC

## 2012-06-19 MED ORDER — DOCUSATE SODIUM 100 MG PO CAPS: 100.0000 mg | ORAL_CAPSULE | Freq: Two times a day (BID) | ORAL | Status: DC

## 2012-06-19 MED ORDER — FLEET ENEMA 7-19 GM/118ML RE ENEM
1.0000 | ENEMA | Freq: Once | RECTAL | Status: AC
  Administered 2012-06-19: 1 via RECTAL

## 2012-06-19 NOTE — MAU Note (Signed)
Kidneys hurt, hurts when she pees, going a lot.  Started a few days ago.  Has been constipated, has to push to go. Had uterine ablation a couple months ago.

## 2012-06-19 NOTE — MAU Note (Signed)
+   CVA tenderness, bilateral  Name and DOB verified, pt confirms spelling is correct on armband.

## 2012-06-19 NOTE — MAU Provider Note (Signed)
Attestation of Attending Supervision of Advanced Practitioner (CNM/NP): Evaluation and management procedures were performed by the Advanced Practitioner under my supervision and collaboration.  I have reviewed the Advanced Practitioner's note and chart, and I agree with the management and plan.  HARRAWAY-SMITH, Gilda Abboud 1:48 PM     

## 2012-06-19 NOTE — MAU Provider Note (Signed)
History     CSN: [id number]  Arrival date and time: 06/19/12 4098   First Provider Initiated Contact with Patient 06/19/12 [phone]      Chief Complaint  Patient presents with  . Back Pain   HPI Pt is not pregnant and presents with lower abd pain radiating to back for about 1 week.  She has not taken anything for pain.  She is constipated with small hard bowel movements for heavy periods in January.  She did fine until about 1 week ago.  She had IC 2 weeks ago without any pain. She is having frequent urination with large quantities.  Pt does not have a history of constipation.  Before her surgery she had soft loose stools.  Past Medical History  Diagnosis Date  . Fibroid uterus   . Hyperlipemia   . No pertinent past medical history     Past Surgical History  Procedure Laterality Date  . Salpingectomy  1998  . Appendectomy    . Dilitation & currettage/hystroscopy with novasure ablation  04/06/2012    Procedure: DILATATION & CURETTAGE/HYSTEROSCOPY WITH NOVASURE ABLATION;  Surgeon: Willodean Rosenthal, MD;  Location: [facility];  Service: Gynecology;  Laterality: N/A;    Family History  Problem Relation Age of Onset  . Hypertension Mother   . Diabetes Father     History  Substance Use Topics  . Smoking status: Former Games developer  . Smokeless tobacco: Never Used  . Alcohol Use: No    Allergies: No Known Allergies  No prescriptions prior to admission    Review of Systems  Constitutional: Negative for fever and chills.  Gastrointestinal: Positive for nausea, abdominal pain and constipation. Negative for heartburn, vomiting and diarrhea.  Genitourinary: Negative for dysuria and urgency.  Musculoskeletal: Positive for back pain.   Physical Exam   Blood pressure 135/87, pulse 72, temperature 97.9 F (36.6 C), temperature source Oral, resp. rate 18, height 5' 2.5" (1.588 m), weight 166 lb (75.297 kg).  Physical Exam  Nursing note and vitals reviewed. Constitutional: She is  oriented to person, place, and time. She appears well-developed and well-nourished.  HENT:  Head: Normocephalic.  Eyes: Pupils are equal, round, and reactive to light.  Neck: Normal range of motion. Neck supple.  Cardiovascular: Normal rate.   Respiratory: Effort normal.  GI: Soft. Bowel sounds are normal. She exhibits no distension. There is tenderness. There is no rebound and no guarding.  Genitourinary:  Small amount of yellow creamy discharge in vault; cervix parous, NT; uterus NSSC NT; adnexa without palpable enlargement or tenderness; abdomen diffusely tender with palpation- no rebound  Musculoskeletal: Normal range of motion.  Neurological: She is alert and oriented to person, place, and time.  Skin: Skin is warm and dry.  Psychiatric: She has a normal mood and affect.    MAU Course  Procedures  Results for orders placed during the hospital encounter of 06/19/12 (from the past 24 hour(s))  URINALYSIS, ROUTINE W REFLEX MICROSCOPIC     Status: None   Collection Time    06/19/12  8:16 AM      Result Value Range   Color, Urine YELLOW  YELLOW   APPearance CLEAR  CLEAR   Specific Gravity, Urine 1.025  1.005 - 1.030   pH 6.5  5.0 - 8.0   Glucose, UA NEGATIVE  NEGATIVE mg/dL   Hgb urine dipstick NEGATIVE  NEGATIVE   Bilirubin Urine NEGATIVE  NEGATIVE   Ketones, ur NEGATIVE  NEGATIVE mg/dL   Protein, ur NEGATIVE  NEGATIVE mg/dL   Urobilinogen, UA 0.2  0.0 - 1.0 mg/dL   Nitrite NEGATIVE  NEGATIVE   Leukocytes, UA NEGATIVE  NEGATIVE  CBC     Status: None   Collection Time    06/19/12  8:55 AM      Result Value Range   WBC 5.8  4.0 - 10.5 K/uL   RBC 4.82  3.87 - 5.11 MIL/uL   Hemoglobin 14.2  12.0 - 15.0 g/dL   HCT 16.1  09.6 - 04.5 %   MCV 84.2  78.0 - 100.0 fL   MCH 29.5  26.0 - 34.0 pg   MCHC 35.0  30.0 - 36.0 g/dL   RDW 40.9  81.1 - 91.4 %   Platelets 237  150 - 400 K/uL  COMPREHENSIVE METABOLIC PANEL     Status: Abnormal   Collection Time    06/19/12  9:00 AM       Result Value Range   Sodium 141  135 - 145 mEq/L   Potassium 4.2  3.5 - 5.1 mEq/L   Chloride 105  96 - 112 mEq/L   CO2 26  19 - 32 mEq/L   Glucose, Bld 104 (*) 70 - 99 mg/dL   BUN 10  6 - 23 mg/dL   Creatinine, Ser 7.82  0.50 - 1.10 mg/dL   Calcium 9.5  8.4 - 95.6 mg/dL   Total Protein 6.5  6.0 - 8.3 g/dL   Albumin 3.6  3.5 - 5.2 g/dL   AST 29  0 - 37 U/L   ALT 39 (*) 0 - 35 U/L   Alkaline Phosphatase 82  39 - 117 U/L   Total Bilirubin 0.5  0.3 - 1.2 mg/dL   GFR calc non Af Amer >90  >90 mL/min   GFR calc Af Amer >90  >90 mL/min  WET PREP, GENITAL     Status: Abnormal   Collection Time    06/19/12  9:34 AM      Result Value Range   Yeast Wet Prep HPF POC NONE SEEN  NONE SEEN   Trich, Wet Prep NONE SEEN  NONE SEEN   Clue Cells Wet Prep HPF POC NONE SEEN  NONE SEEN   WBC, Wet Prep HPF POC FEW (*) NONE SEEN  Toradol 60mg  IM  Enema ordered Pt felt much better after enema Ready to go home  Assessment and Plan  Abdominal pain Constipation- Miralax and Metamucil; colace and instructions on diet and constipation F/u in GYN clinic for appointment- pt to make appointment Lanell Dubie 06/19/2012, 8:49 AM

## 2012-07-29 ENCOUNTER — Emergency Department (HOSPITAL_COMMUNITY)
Admission: EM | Admit: 2012-07-29 | Discharge: 2012-07-29 | Disposition: A | Discharge status: Home / Self Care | Payer: Medicaid Other | Attending: Emergency Medicine | Admitting: Emergency Medicine

## 2012-07-29 ENCOUNTER — Encounter (HOSPITAL_COMMUNITY): Payer: Self-pay | Admitting: *Deleted

## 2012-07-29 DIAGNOSIS — N3090 Cystitis, unspecified without hematuria: Secondary | ICD-10-CM | POA: Insufficient documentation

## 2012-07-29 DIAGNOSIS — Z8542 Personal history of malignant neoplasm of other parts of uterus: Secondary | ICD-10-CM | POA: Insufficient documentation

## 2012-07-29 DIAGNOSIS — Z8639 Personal history of other endocrine, nutritional and metabolic disease: Secondary | ICD-10-CM | POA: Insufficient documentation

## 2012-07-29 DIAGNOSIS — Z862 Personal history of diseases of the blood and blood-forming organs and certain disorders involving the immune mechanism: Secondary | ICD-10-CM | POA: Insufficient documentation

## 2012-07-29 DIAGNOSIS — R319 Hematuria, unspecified: Secondary | ICD-10-CM | POA: Insufficient documentation

## 2012-07-29 DIAGNOSIS — R10819 Abdominal tenderness, unspecified site: Secondary | ICD-10-CM | POA: Insufficient documentation

## 2012-07-29 DIAGNOSIS — Z8719 Personal history of other diseases of the digestive system: Secondary | ICD-10-CM | POA: Insufficient documentation

## 2012-07-29 LAB — URINALYSIS, ROUTINE W REFLEX MICROSCOPIC
Glucose, UA: NEGATIVE mg/dL
Ketones, ur: NEGATIVE mg/dL
Nitrite: POSITIVE — AB
Protein, ur: 100 mg/dL — AB
Specific Gravity, Urine: 1.029 (ref 1.005–1.030)
Urobilinogen, UA: 1 mg/dL (ref 0.0–1.0)
pH: 7 (ref 5.0–8.0)

## 2012-07-29 MED ORDER — PHENAZOPYRIDINE HCL 200 MG PO TABS
200.0000 mg | ORAL_TABLET | Freq: Three times a day (TID) | ORAL | Status: AC
  Administered 2012-07-29: 200 mg via ORAL

## 2012-07-29 MED ORDER — PHENAZOPYRIDINE HCL 200 MG PO TABS
200.0000 mg | ORAL_TABLET | Freq: Three times a day (TID) | ORAL | Status: DC
  Filled 2012-07-29: qty 1

## 2012-07-29 MED ORDER — CEPHALEXIN 500 MG PO CAPS
500.0000 mg | ORAL_CAPSULE | Freq: Once | ORAL | Status: AC
  Administered 2012-07-29: 500 mg via ORAL
  Filled 2012-07-29: qty 1

## 2012-07-29 MED ORDER — CEPHALEXIN 500 MG PO CAPS: 500.0000 mg | ORAL_CAPSULE | Freq: Three times a day (TID) | ORAL | Status: DC

## 2012-07-29 MED ORDER — PHENAZOPYRIDINE HCL 200 MG PO TABS: 200.0000 mg | ORAL_TABLET | Freq: Three times a day (TID) | ORAL | Status: DC

## 2012-07-29 NOTE — Discharge Instructions (Signed)
Antibiotics as prescribed until all gone. Pyridium for symptoms. Drink plenty of fluids. Follow up if not improving. Return if worsening symptoms.    Urinary Tract Infection Urinary tract infections (UTIs) can develop anywhere along your urinary tract. Your urinary tract is your body's drainage system for removing wastes and extra water. Your urinary tract includes two kidneys, two ureters, a bladder, and a urethra. Your kidneys are a pair of bean-shaped organs. Each kidney is about the size of your fist. They are located below your ribs, one on each side of your spine. CAUSES Infections are caused by microbes, which are microscopic organisms, including fungi, viruses, and bacteria. These organisms are so small that they can only be seen through a microscope. Bacteria are the microbes that most commonly cause UTIs. SYMPTOMS  Symptoms of UTIs may vary by age and gender of the patient and by the location of the infection. Symptoms in young women typically include a frequent and intense urge to urinate and a painful, burning feeling in the bladder or urethra during urination. Older women and men are more likely to be tired, shaky, and weak and have muscle aches and abdominal pain. A fever may mean the infection is in your kidneys. Other symptoms of a kidney infection include pain in your back or sides below the ribs, nausea, and vomiting. DIAGNOSIS To diagnose a UTI, your caregiver will ask you about your symptoms. Your caregiver also will ask to provide a urine sample. The urine sample will be tested for bacteria and white blood cells. White blood cells are made by your body to help fight infection. TREATMENT  Typically, UTIs can be treated with medication. Because most UTIs are caused by a bacterial infection, they usually can be treated with the use of antibiotics. The choice of antibiotic and length of treatment depend on your symptoms and the type of bacteria causing your infection. HOME CARE  INSTRUCTIONS  If you were prescribed antibiotics, take them exactly as your caregiver instructs you. Finish the medication even if you feel better after you have only taken some of the medication.  Drink enough water and fluids to keep your urine clear or pale yellow.  Avoid caffeine, tea, and carbonated beverages. They tend to irritate your bladder.  Empty your bladder often. Avoid holding urine for long periods of time.  Empty your bladder before and after sexual intercourse.  After a bowel movement, women should cleanse from front to back. Use each tissue only once. SEEK MEDICAL CARE IF:   You have back pain.  You develop a fever.  Your symptoms do not begin to resolve within 3 days. SEEK IMMEDIATE MEDICAL CARE IF:   You have severe back pain or lower abdominal pain.  You develop chills.  You have nausea or vomiting.  You have continued burning or discomfort with urination. MAKE SURE YOU:   Understand these instructions.  Will watch your condition.  Will get help right away if you are not doing well or get worse. Document Released: 12/19/2004 Document Revised: 09/10/2011 Document Reviewed: 04/19/2011 [facility] Patient Information 2013 [place], [place].

## 2012-07-29 NOTE — ED Provider Notes (Signed)
History    This chart was scribed for Jaynie Crumble, PA working with Raeford Razor, MD by ED Scribe, Burman Nieves. This patient was seen in room WTR5/WTR5 and the patient's care was started at 9:30 PM.   CSN: [id number]  Arrival date & time 07/29/12  1948   First MD Initiated Contact with Patient 07/29/12 2130      Chief Complaint  Patient presents with  . Dysuria    (Consider location/radiation/quality/duration/timing/severity/associated sxs/prior treatment) The history is provided by the patient. No language interpreter was used.    HPI Comments: Jill Vargas is a 47 y.o. female with h/o of fibroid uterus who presents to the Emergency Department complaining of dysuria onset today. Pt states that when she urinates it hurts in her lower back and pelvic region with hematuria evident as well. She states that it takes all of her energy to use the bathroom and gets a continuous urinating sensation. Pt denies fever, chills, cough, nausea, vomiting, diarrhea, SOB, weakness, and any other associated symptoms. Pt denies a current PCP.    Past Medical History  Diagnosis Date  . Fibroid uterus   . Hyperlipemia   . No pertinent past medical history   . Constipation     Past Surgical History  Procedure Laterality Date  . Salpingectomy  1998  . Appendectomy    . Dilitation & currettage/hystroscopy with novasure ablation  04/06/2012    Procedure: DILATATION & CURETTAGE/HYSTEROSCOPY WITH NOVASURE ABLATION;  Surgeon: Willodean Rosenthal, MD;  Location: [facility];  Service: Gynecology;  Laterality: N/A;    Family History  Problem Relation Age of Onset  . Hypertension Mother   . Diabetes Father     History  Substance Use Topics  . Smoking status: Former Games developer  . Smokeless tobacco: Never Used  . Alcohol Use: No    OB History   Grav Para Term Preterm Abortions TAB SAB Ect Mult Living   3 3 1 2  0 0 0 0 0 3      Review of Systems  Genitourinary: Positive for dysuria and  hematuria.  All other systems reviewed and are negative.    Allergies  Review of patient's allergies indicates no known allergies.  Home Medications  No current outpatient prescriptions on file.  BP 119/65  Pulse 72  Temp(Src) 98.7 F (37.1 C) (Oral)  Resp 16  SpO2 97%  Physical Exam  Nursing note and vitals reviewed. Constitutional: She is oriented to person, place, and time. She appears well-developed and well-nourished. No distress.  HENT:  Head: Normocephalic and atraumatic.  Eyes: EOM are normal.  Neck: Neck supple. No tracheal deviation present.  Cardiovascular: Normal rate, regular rhythm and normal heart sounds.   Pulmonary/Chest: Effort normal and breath sounds normal. No respiratory distress.  Musculoskeletal: Normal range of motion.  No CVA tenderness bilaterally. Super pubic tenderness upon palpation.  Neurological: She is alert and oriented to person, place, and time.  Skin: Skin is warm and dry.  Psychiatric: She has a normal mood and affect. Her behavior is normal.    ED Course  Procedures (including critical care time) DIAGNOSTIC STUDIES: Oxygen Saturation is 97% on room air, adequate by my interpretation.    COORDINATION OF CARE: 10:38 PM Discussed ED treatment with pt and pt agrees.     Results for orders placed during the hospital encounter of 07/29/12  URINALYSIS, ROUTINE W REFLEX MICROSCOPIC      Result Value Range   Color, Urine YELLOW  YELLOW   APPearance CLOUDY (*)  CLEAR   Specific Gravity, Urine 1.029  1.005 - 1.030   pH 7.0  5.0 - 8.0   Glucose, UA NEGATIVE  NEGATIVE mg/dL   Hgb urine dipstick LARGE (*) NEGATIVE   Bilirubin Urine SMALL (*) NEGATIVE   Ketones, ur NEGATIVE  NEGATIVE mg/dL   Protein, ur 454 (*) NEGATIVE mg/dL   Urobilinogen, UA 1.0  0.0 - 1.0 mg/dL   Nitrite POSITIVE (*) NEGATIVE   Leukocytes, UA MODERATE (*) NEGATIVE  URINE MICROSCOPIC-ADD ON      Result Value Range   Squamous Epithelial / LPF RARE  RARE   WBC, UA  11-20  <3 WBC/hpf   RBC / HPF 7-10  <3 RBC/hpf   Bacteria, UA FEW (*) RARE   No results found.    1. Cystitis       MDM  Pt with urinary symptoms. No flank pan. No fever, chills, nausea, vomiting, malaise. No CVA tenderness on exam. No vaginal complaints. UA positive for nitrite and leukocyte esterase, wbc 11-20. Will treat. Given pyridium in ED and keflex. Home on keflex and close follow up. Cultures sent.     I personally performed the services described in this documentation, which was scribed in my presence. The recorded information has been reviewed and is accurate.      Lottie Mussel, PA-C 07/30/12 [phone]

## 2012-07-29 NOTE — ED Notes (Addendum)
Pt c/o pain with urination. Onset today. Pt denies n/v. Or fever. Also reports hematuria.

## 2012-07-30 LAB — URINE MICROSCOPIC-ADD ON

## 2012-07-31 LAB — URINE CULTURE: Colony Count: >=100000

## 2012-08-01 ENCOUNTER — Telehealth (HOSPITAL_COMMUNITY): Payer: Self-pay | Admitting: Emergency Medicine

## 2012-08-01 NOTE — ED Notes (Signed)
Post ED Visit - Positive Culture Follow-up  Culture report reviewed by antimicrobial stewardship pharmacist: []  Wes Dulaney, Pharm.D., BCPS []  Celedonio Miyamoto, [address].D., BCPS [x]  Georgina Pillion, Pharm.D., BCPS []  [place], [address].D., BCPS, AAHIVP []  Estella Husk, Pharm.D., BCPS, AAHIV  Positive urine culture Treated with Keflex, organism sensitive to the same and no further patient follow-up is required at this time.  Kylie A Holland 08/01/2012, 5:30 PM

## 2012-08-04 NOTE — ED Provider Notes (Signed)
Medical screening examination/treatment/procedure(s) were performed by non-physician practitioner and as supervising physician I was immediately available for consultation/collaboration.  Ramesha Poster, MD 08/04/12 1436 

## 2012-12-04 ENCOUNTER — Encounter: Payer: Self-pay | Admitting: Neurology

## 2012-12-04 ENCOUNTER — Ambulatory Visit: Payer: Self-pay | Admitting: Neurology

## 2012-12-04 DIAGNOSIS — R519 Headache, unspecified: Secondary | ICD-10-CM

## 2013-01-01 ENCOUNTER — Other Ambulatory Visit: Payer: Self-pay | Admitting: Nurse Practitioner

## 2013-01-01 DIAGNOSIS — Z1231 Encounter for screening mammogram for malignant neoplasm of breast: Secondary | ICD-10-CM

## 2013-10-23 DIAGNOSIS — N7011 Chronic salpingitis: Secondary | ICD-10-CM

## 2013-10-23 HISTORY — DX: Chronic salpingitis: N70.11

## 2013-11-21 ENCOUNTER — Emergency Department (HOSPITAL_COMMUNITY)
Admission: EM | Admit: 2013-11-21 | Discharge: 2013-11-22 | Disposition: A | Discharge status: Home / Self Care | Payer: PRIVATE HEALTH INSURANCE | Attending: Emergency Medicine | Admitting: Emergency Medicine

## 2013-11-21 ENCOUNTER — Encounter (HOSPITAL_COMMUNITY): Payer: Self-pay | Admitting: Emergency Medicine

## 2013-11-21 DIAGNOSIS — Z87891 Personal history of nicotine dependence: Secondary | ICD-10-CM | POA: Insufficient documentation

## 2013-11-21 DIAGNOSIS — N7013 Chronic salpingitis and oophoritis: Secondary | ICD-10-CM | POA: Insufficient documentation

## 2013-11-21 DIAGNOSIS — R110 Nausea: Secondary | ICD-10-CM | POA: Insufficient documentation

## 2013-11-21 DIAGNOSIS — Z862 Personal history of diseases of the blood and blood-forming organs and certain disorders involving the immune mechanism: Secondary | ICD-10-CM | POA: Diagnosis not present

## 2013-11-21 DIAGNOSIS — Z3202 Encounter for pregnancy test, result negative: Secondary | ICD-10-CM | POA: Insufficient documentation

## 2013-11-21 DIAGNOSIS — Z8719 Personal history of other diseases of the digestive system: Secondary | ICD-10-CM | POA: Diagnosis not present

## 2013-11-21 DIAGNOSIS — N7011 Chronic salpingitis: Secondary | ICD-10-CM

## 2013-11-21 DIAGNOSIS — R1032 Left lower quadrant pain: Secondary | ICD-10-CM | POA: Insufficient documentation

## 2013-11-21 DIAGNOSIS — Z8639 Personal history of other endocrine, nutritional and metabolic disease: Secondary | ICD-10-CM | POA: Diagnosis not present

## 2013-11-21 NOTE — ED Notes (Signed)
Pt arrived to the Ed with a complaint of left flank pain.  Pt states the pain has been present for a week.  Pt was seen by urgent Care and given zofran with out relief of symptoms.  Pt states her leg is feeling numb and her kidneys hurt.

## 2013-11-22 ENCOUNTER — Emergency Department (HOSPITAL_COMMUNITY): Payer: PRIVATE HEALTH INSURANCE

## 2013-11-22 ENCOUNTER — Encounter (HOSPITAL_COMMUNITY): Payer: Self-pay | Admitting: Radiology

## 2013-11-22 LAB — CBC WITH DIFFERENTIAL/PLATELET
BASOS ABS: 0 10*3/uL (ref 0.0–0.1)
Basophils Relative: 1 % (ref 0–1)
Eosinophils Absolute: 0.2 10*3/uL (ref 0.0–0.7)
Eosinophils Relative: 4 % (ref 0–5)
HEMATOCRIT: 40.4 % (ref 36.0–46.0)
Hemoglobin: 14.1 g/dL (ref 12.0–15.0)
LYMPHS PCT: 42 % (ref 12–46)
Lymphs Abs: 2.8 10*3/uL (ref 0.7–4.0)
MCH: 29.7 pg (ref 26.0–34.0)
MCHC: 34.9 g/dL (ref 30.0–36.0)
MCV: 85.2 fL (ref 78.0–100.0)
Monocytes Absolute: 0.7 10*3/uL (ref 0.1–1.0)
Monocytes Relative: 10 % (ref 3–12)
NEUTROS ABS: 2.8 10*3/uL (ref 1.7–7.7)
NEUTROS PCT: 43 % (ref 43–77)
PLATELETS: 240 10*3/uL (ref 150–400)
RBC: 4.74 MIL/uL (ref 3.87–5.11)
RDW: 12.3 % (ref 11.5–15.5)
WBC: 6.6 10*3/uL (ref 4.0–10.5)

## 2013-11-22 LAB — URINALYSIS, ROUTINE W REFLEX MICROSCOPIC
Bilirubin Urine: NEGATIVE
Glucose, UA: NEGATIVE mg/dL
HGB URINE DIPSTICK: NEGATIVE
Ketones, ur: NEGATIVE mg/dL
LEUKOCYTES UA: NEGATIVE
NITRITE: NEGATIVE
Protein, ur: NEGATIVE mg/dL
SPECIFIC GRAVITY, URINE: 1.028 (ref 1.005–1.030)
UROBILINOGEN UA: 0.2 mg/dL (ref 0.0–1.0)
pH: 7 (ref 5.0–8.0)

## 2013-11-22 LAB — COMPREHENSIVE METABOLIC PANEL
ALT: 22 U/L (ref 0–35)
AST: 20 U/L (ref 0–37)
Albumin: 3.6 g/dL (ref 3.5–5.2)
Alkaline Phosphatase: 80 U/L (ref 39–117)
Anion gap: 12 (ref 5–15)
BILIRUBIN TOTAL: 0.3 mg/dL (ref 0.3–1.2)
BUN: 10 mg/dL (ref 6–23)
CHLORIDE: 107 meq/L (ref 96–112)
CO2: 24 meq/L (ref 19–32)
Calcium: 9.1 mg/dL (ref 8.4–10.5)
Creatinine, Ser: 0.73 mg/dL (ref 0.50–1.10)
GFR calc Af Amer: >90 mL/min (ref >90)
Glucose, Bld: 126 mg/dL — ABNORMAL HIGH (ref 70–99)
POTASSIUM: 4.2 meq/L (ref 3.7–5.3)
SODIUM: 143 meq/L (ref 137–147)
Total Protein: 6.6 g/dL (ref 6.0–8.3)

## 2013-11-22 LAB — WET PREP, GENITAL
CLUE CELLS WET PREP: NONE SEEN
TRICH WET PREP: NONE SEEN
YEAST WET PREP: NONE SEEN

## 2013-11-22 LAB — PREGNANCY, URINE: PREG TEST UR: NEGATIVE

## 2013-11-22 LAB — LIPASE, BLOOD: Lipase: 41 U/L (ref 11–59)

## 2013-11-22 MED ORDER — AZITHROMYCIN 250 MG PO TABS
1000.0000 mg | ORAL_TABLET | Freq: Once | ORAL | Status: AC
  Administered 2013-11-22: 1000 mg via ORAL
  Filled 2013-11-22: qty 4

## 2013-11-22 MED ORDER — DOXYCYCLINE HYCLATE 100 MG PO TABS: 100.0000 mg | ORAL_TABLET | Freq: Two times a day (BID) | ORAL | Status: DC

## 2013-11-22 MED ORDER — DOXYCYCLINE HYCLATE 100 MG PO TABS
100.0000 mg | ORAL_TABLET | Freq: Once | ORAL | Status: AC
  Administered 2013-11-22: 100 mg via ORAL
  Filled 2013-11-22: qty 1

## 2013-11-22 MED ORDER — HYDROCODONE-ACETAMINOPHEN 5-325 MG PO TABS: 2.0000 | ORAL_TABLET | ORAL | Status: DC | PRN

## 2013-11-22 MED ORDER — ONDANSETRON 4 MG PO TBDP
4.0000 mg | ORAL_TABLET | Freq: Once | ORAL | Status: AC
Start: 2013-11-22 — End: 2013-11-22
  Administered 2013-11-22: 4 mg via ORAL
  Filled 2013-11-22: qty 1

## 2013-11-22 MED ORDER — METRONIDAZOLE 500 MG PO TABS: 500.0000 mg | ORAL_TABLET | Freq: Two times a day (BID) | ORAL | Status: DC

## 2013-11-22 MED ORDER — MORPHINE SULFATE 4 MG/ML IJ SOLN
4.0000 mg | Freq: Once | INTRAMUSCULAR | Status: AC
  Administered 2013-11-22: 4 mg via INTRAVENOUS
  Filled 2013-11-22: qty 1

## 2013-11-22 MED ORDER — LIDOCAINE HCL 1 % IJ SOLN
INTRAMUSCULAR | Status: AC
  Administered 2013-11-22: 0.9 mL
  Filled 2013-11-22: qty 20

## 2013-11-22 MED ORDER — ONDANSETRON HCL 4 MG/2ML IJ SOLN
4.0000 mg | Freq: Once | INTRAMUSCULAR | Status: AC
  Administered 2013-11-22: 4 mg via INTRAVENOUS
  Filled 2013-11-22: qty 2

## 2013-11-22 MED ORDER — CEFTRIAXONE SODIUM 250 MG IJ SOLR
250.0000 mg | Freq: Once | INTRAMUSCULAR | Status: AC
  Administered 2013-11-22: 250 mg via INTRAMUSCULAR
  Filled 2013-11-22: qty 250

## 2013-11-22 MED ORDER — SODIUM CHLORIDE 0.9 % IV BOLUS (SEPSIS)
1000.0000 mL | Freq: Once | INTRAVENOUS | Status: AC
  Administered 2013-11-22: 1000 mL via INTRAVENOUS

## 2013-11-22 NOTE — ED Provider Notes (Signed)
CSN: [id number]     Arrival date & time 11/21/13  2200 History   First MD Initiated Contact with Patient 11/21/13 2309     Chief Complaint  Patient presents with  . Flank Pain     (Consider location/radiation/quality/duration/timing/severity/associated sxs/prior Treatment) HPI [name] is a 48 year old female with past medical history of hyperlipidemia, uterine fibroid who presents with 4 days of left flank pain. Patient states her pain actually began gradually in her left lower quadrant, and migrated up towards her left flank. Patient states the pain began gradually on Thursday, and has been persistent in an "on and off" nature since then. Patient states there are no aggravating or alleviating factors. She reports some associated nausea, with no associated vomiting, fever, diarrhea, dysuria, vaginal bleeding, vaginal discharge, vaginal pain, pelvic pain. patient seen at urgent care on Friday, and given Zofran with no relief of symptoms.  Past Medical History  Diagnosis Date  . Fibroid uterus   . Hyperlipemia   . No pertinent past medical history   . Constipation   . HA (headache)    Past Surgical History  Procedure Laterality Date  . Salpingectomy  1998  . Appendectomy    . Dilitation & currettage/hystroscopy with novasure ablation  04/06/2012    Procedure: DILATATION & CURETTAGE/HYSTEROSCOPY WITH NOVASURE ABLATION;  Surgeon: Lavonia Drafts, MD;  Location: [facility];  Service: Gynecology;  Laterality: N/A;   Family History  Problem Relation Age of Onset  . Hypertension Mother   . Diabetes Father    History  Substance Use Topics  . Smoking status: Former Research scientist (life sciences)  . Smokeless tobacco: Never Used  . Alcohol Use: No   OB History   Grav Para Term Preterm Abortions TAB SAB Ect Mult Living   3 3 1 2  0 0 0 0 0 3     Review of Systems  Constitutional: Negative for fever.  HENT: Negative for trouble swallowing.   Eyes: Negative for visual disturbance.  Respiratory:  Negative for shortness of breath.   Cardiovascular: Negative for chest pain.  Gastrointestinal: Positive for nausea and abdominal pain. Negative for vomiting, diarrhea and constipation.  Genitourinary: Positive for flank pain. Negative for dysuria, urgency, vaginal bleeding, vaginal discharge, vaginal pain and pelvic pain.  Musculoskeletal: Negative for neck pain.  Skin: Negative for rash.  Neurological: Negative for dizziness, weakness, light-headedness and numbness.  Psychiatric/Behavioral: Negative.       Allergies  Review of patient's allergies indicates no known allergies.  Home Medications   Prior to Admission medications   Medication Sig Start Date End Date Taking? Authorizing Provider  ondansetron (ZOFRAN-ODT) 4 MG disintegrating tablet Take 4 mg by mouth every 8 (eight) hours as needed for nausea or vomiting.   Yes Historical Provider, MD   BP 121/74  Pulse 55  Temp(Src) 98.3 F (36.8 C) (Oral)  Resp 14  SpO2 95% Physical Exam  Nursing note and vitals reviewed. Constitutional: She is oriented to person, place, and time. She appears well-developed and well-nourished. No distress.  HENT:  Head: Normocephalic and atraumatic.  Mouth/Throat: Oropharynx is clear and moist. No oropharyngeal exudate.  Eyes: Right eye exhibits no discharge. Left eye exhibits no discharge. No scleral icterus.  Neck: Normal range of motion.  Cardiovascular: Normal rate, regular rhythm and normal heart sounds.   No murmur heard. Pulmonary/Chest: Effort normal and breath sounds normal. No respiratory distress.  Abdominal: Soft. Normal appearance and bowel sounds are normal. There is tenderness in the left upper quadrant and left lower  quadrant. There is CVA tenderness. There is no rigidity, no guarding, no tenderness at McBurney's point and negative Murphy's sign.  Musculoskeletal: Normal range of motion. She exhibits no edema and no tenderness.  Neurological: She is alert and oriented to person,  place, and time. No cranial nerve deficit. Coordination normal.  Skin: Skin is warm and dry. No rash noted. She is not diaphoretic.  Psychiatric: She has a normal mood and affect.    ED Course  Procedures (including critical care time) Labs Review Labs Reviewed  URINALYSIS, ROUTINE W REFLEX MICROSCOPIC - Abnormal; Notable for the following:    APPearance CLOUDY (*)    All other components within normal limits  COMPREHENSIVE METABOLIC PANEL - Abnormal; Notable for the following:    Glucose, Bld 126 (*)    All other components within normal limits  GC/CHLAMYDIA PROBE AMP  WET PREP, GENITAL  PREGNANCY, URINE  CBC WITH DIFFERENTIAL  LIPASE, BLOOD    Imaging Review Ct Renal Stone Study  11/22/2013   CLINICAL DATA:  Left flank pain  EXAM: CT RENAL STONE PROTOCOL  TECHNIQUE: Multidetector CT imaging of the abdomen and pelvis was performed following the standard protocol without intravenous contrast  COMPARISON:  None.  FINDINGS: Mild linear opacity right lower lobe, favor atelectasis. Normal heart size.  Organ evaluation is limited in the absence of intravenous contrast. Within this limitation; hepatic steatosis.  No radiodense gallstones or biliary ductal dilatation.  No appreciable abnormality of the spleen, pancreas, adrenal glands.  Symmetric renal size. No urinary tract calculi. No hydroureteronephrosis.  No overt colitis. Appendix absent. No bowel obstruction. No free intraperitoneal air or fluid. No lymphadenopathy.  Partially decompressed bladder. No appreciable abnormality of the uterus. Tubular cystic density left adnexal may reflect hydrosalpinx.  Normal caliber aorta and branch vessels.  Right acetabulum bone island.  Mild multilevel degenerative changes.  IMPRESSION: Tubular cystic density left adnexal may reflect hydrosalpinx. Recommend pelvic ultrasound.  Hepatic steatosis.   Electronically Signed   By: Carlos Levering M.D.   On: 11/22/2013 02:33     EKG Interpretation None       MDM   Final diagnoses:  None    48 year old female with left flank pain and left lower quadrant abdominal pain for the past 4 days. Patient reporting her pain as a colicky pain which has no aggravating or alleviating factors. Abdominal tenderness noted in left lower quadrant with mild tenderness in left upper quadrant. CVA tenderness positive on left side. Patient states she does not have menstrual cycles after having uterine ablation. Patient states she is status post appendectomy, left-sided salpingectomy. Workup to include a UA, urine pregnancy, CBC, CMP, lipase , CT renal stone study. Patient's pain and nausea will be treated in the ER. Patient fully alert, in no acute distress.   1:50 AM: Patient signed out to Florene Glen, NP-C to await results of CT study and followup based on findings.  Signed,  Dahlia Bailiff, PA-C 3:20 AM  Patient seen and discussed with Dr. Linton Flemings, MD.   Carrie Mew, PA-C 11/22/13 [phone]

## 2013-11-22 NOTE — Discharge Instructions (Signed)
Pelvic Inflammatory Disease Pelvic inflammatory disease (PID) is an infection in some or all of the female organs. PID can be in the uterus, ovaries, fallopian tubes, or the surrounding tissues inside the lower belly area (pelvis). HOME CARE   If given, take your antibiotic medicine as told. Finish them even if you start to feel better.  Only take medicine as told by your doctor.  Do not have sex (intercourse) until treatment is done or as told by your doctor.  Tell your sex partner if you have PID. Your partner may need to be treated.  Keep all doctor visits. GET HELP RIGHT AWAY IF:   You have a fever.  You have more belly (abdominal) or lower belly pain.  You have chills.  You have pain when you pee (urinate).  You are not better after 72 hours.  You have more fluid (discharge) coming from your vagina or fluid that is not normal.  You need pain medicine from your doctor.  You throw up (vomit).  You cannot take your medicines.  Your partner has a sexually transmitted disease (STD). MAKE SURE YOU:   Understand these instructions.  Will watch your condition.  Will get help right away if you are not doing well or get worse. Document Released: 06/07/2008 Document Revised: 07/06/2012 Document Reviewed: 03/07/2011 [facility] Patient Information 2015 [place], [place]. This information is not intended to replace advice given to you by your health care provider. Make sure you discuss any questions you have with your health care provider. You have a fallopian tube that is inflamed and infected you have been given antibiotics in the ED as well as prescriptions to take at home   Please make an appointment with your GYN doctor for follow up care

## 2013-11-22 NOTE — ED Provider Notes (Signed)
Medical screening examination/treatment/procedure(s) were performed by non-physician practitioner and as supervising physician I was immediately available for consultation/collaboration.   EKG Interpretation None       Kalman Drape, MD 11/22/13 [phone]

## 2013-11-22 NOTE — ED Provider Notes (Signed)
Medical screening examination/treatment/procedure(s) were performed by non-physician practitioner and as supervising physician I was immediately available for consultation/collaboration.   EKG Interpretation None       Kalman Drape, MD 11/22/13 [phone]

## 2013-11-22 NOTE — ED Provider Notes (Signed)
Pelvic exam   L adanexial  fullness exquisite tenderness Will obtain pelvic ultrasound per radiologist request  Will treat with antibiotic in ED as well as Flagyl and Doxycycline at home with FU by [place], NP 11/22/13 0458  Garald Balding, NP 11/22/13 [phone]

## 2013-11-23 LAB — GC/CHLAMYDIA PROBE AMP
CT Probe RNA: NEGATIVE
GC Probe RNA: NEGATIVE

## 2013-12-14 ENCOUNTER — Emergency Department (HOSPITAL_COMMUNITY): Payer: PRIVATE HEALTH INSURANCE

## 2013-12-14 ENCOUNTER — Emergency Department (HOSPITAL_COMMUNITY)
Admission: EM | Admit: 2013-12-14 | Discharge: 2013-12-15 | Disposition: A | Discharge status: Home / Self Care | Payer: PRIVATE HEALTH INSURANCE | Attending: Emergency Medicine | Admitting: Emergency Medicine

## 2013-12-14 ENCOUNTER — Encounter (HOSPITAL_COMMUNITY): Payer: Self-pay | Admitting: Emergency Medicine

## 2013-12-14 DIAGNOSIS — N831 Corpus luteum cyst of ovary, unspecified side: Secondary | ICD-10-CM | POA: Insufficient documentation

## 2013-12-14 DIAGNOSIS — Z8719 Personal history of other diseases of the digestive system: Secondary | ICD-10-CM | POA: Insufficient documentation

## 2013-12-14 DIAGNOSIS — Z8639 Personal history of other endocrine, nutritional and metabolic disease: Secondary | ICD-10-CM | POA: Insufficient documentation

## 2013-12-14 DIAGNOSIS — R197 Diarrhea, unspecified: Secondary | ICD-10-CM | POA: Diagnosis not present

## 2013-12-14 DIAGNOSIS — N83209 Unspecified ovarian cyst, unspecified side: Secondary | ICD-10-CM

## 2013-12-14 DIAGNOSIS — Z87891 Personal history of nicotine dependence: Secondary | ICD-10-CM | POA: Insufficient documentation

## 2013-12-14 DIAGNOSIS — N898 Other specified noninflammatory disorders of vagina: Secondary | ICD-10-CM | POA: Insufficient documentation

## 2013-12-14 DIAGNOSIS — R10814 Left lower quadrant abdominal tenderness: Secondary | ICD-10-CM

## 2013-12-14 DIAGNOSIS — R109 Unspecified abdominal pain: Secondary | ICD-10-CM | POA: Insufficient documentation

## 2013-12-14 DIAGNOSIS — Z3202 Encounter for pregnancy test, result negative: Secondary | ICD-10-CM | POA: Insufficient documentation

## 2013-12-14 DIAGNOSIS — Z862 Personal history of diseases of the blood and blood-forming organs and certain disorders involving the immune mechanism: Secondary | ICD-10-CM | POA: Insufficient documentation

## 2013-12-14 LAB — URINALYSIS, ROUTINE W REFLEX MICROSCOPIC
Bilirubin Urine: NEGATIVE
Glucose, UA: NEGATIVE mg/dL
Hgb urine dipstick: NEGATIVE
KETONES UR: NEGATIVE mg/dL
LEUKOCYTES UA: NEGATIVE
NITRITE: NEGATIVE
PH: 6 (ref 5.0–8.0)
Protein, ur: NEGATIVE mg/dL
Specific Gravity, Urine: 1.028 (ref 1.005–1.030)
UROBILINOGEN UA: 0.2 mg/dL (ref 0.0–1.0)

## 2013-12-14 LAB — POC URINE PREG, ED: Preg Test, Ur: NEGATIVE

## 2013-12-14 MED ORDER — MORPHINE SULFATE 4 MG/ML IJ SOLN
4.0000 mg | Freq: Once | INTRAMUSCULAR | Status: AC
  Administered 2013-12-14: 4 mg via INTRAVENOUS
  Filled 2013-12-14: qty 1

## 2013-12-14 MED ORDER — SODIUM CHLORIDE 0.9 % IV BOLUS (SEPSIS)
1000.0000 mL | Freq: Once | INTRAVENOUS | Status: AC
  Administered 2013-12-14: 1000 mL via INTRAVENOUS

## 2013-12-14 NOTE — ED Notes (Signed)
Pt was seen last week for abdominal pain, given antibiotics and the pain went away but is now back and worse than before. States she has a swollen fallopian tube. Alert and oriented.

## 2013-12-14 NOTE — ED Notes (Signed)
US at bedside

## 2013-12-14 NOTE — ED Notes (Signed)
Pelvic exam is set up, Pt is ready.

## 2013-12-14 NOTE — ED Provider Notes (Signed)
CSN: [id number]     Arrival date & time 12/14/13  1844 History   First MD Initiated Contact with Patient 12/14/13 2211     Chief Complaint  Patient presents with  . Abdominal Pain     (Consider location/radiation/quality/duration/timing/severity/associated sxs/prior Treatment) The history is provided by the patient. No language interpreter was used.  Jill Vargas is a 48 year old female with past medical history of fibroids, salpingectomy, appendectomy, left hydrosalpinx diagnosed on 11/21/2013 presenting to the ED with worsening left-sided abdominal pain. Patient reports that the pain has increased over the past 2 days described as a sharp pain that is constant with radiation to the left lower back. Stated that the pain is more severe than was the last time that she was seen in the emergency department. Stated that she's been having decrease in appetite. Reported that she's been having dysuria and pressure sensation with urination. Stated that today she had a fever of 100F. Reported that she has been feeling nauseous and has been having episodes of diarrhea. Reported that she has completed her course of antibiotics to Flagyl and doxycycline-approximately 5 days ago. Reported that she's been having vaginal discharge today a brownish reddish discoloration with no odor. Denied headache, dizziness, vomiting, melena, hematochezia, hematuria. PCP none  Past Medical History  Diagnosis Date  . Fibroid uterus   . Hyperlipemia   . No pertinent past medical history   . Constipation   . HA (headache)    Past Surgical History  Procedure Laterality Date  . Salpingectomy  1998  . Appendectomy    . Dilitation & currettage/hystroscopy with novasure ablation  04/06/2012    Procedure: DILATATION & CURETTAGE/HYSTEROSCOPY WITH NOVASURE ABLATION;  Surgeon: Lavonia Drafts, MD;  Location: [facility];  Service: Gynecology;  Laterality: N/A;   Family History  Problem Relation Age of Onset  .  Hypertension Mother   . Diabetes Father    History  Substance Use Topics  . Smoking status: Former Research scientist (life sciences)  . Smokeless tobacco: Never Used  . Alcohol Use: No   OB History   Grav Para Term Preterm Abortions TAB SAB Ect Mult Living   3 3 1 2  0 0 0 0 0 3     Review of Systems  Constitutional: Positive for fever. Negative for chills.  Respiratory: Negative for choking and shortness of breath.   Cardiovascular: Negative for chest pain.  Gastrointestinal: Positive for nausea, abdominal pain and diarrhea. Negative for vomiting, constipation, blood in stool and anal bleeding.  Genitourinary: Positive for dysuria and vaginal discharge. Negative for decreased urine volume, vaginal bleeding and vaginal pain.  Musculoskeletal: Negative for back pain and neck pain.  Neurological: Negative for dizziness, weakness and headaches.      Allergies  Review of patient's allergies indicates no known allergies.  Home Medications   Prior to Admission medications   Not on File   BP 125/63  Pulse 63  Temp(Src) 97.7 F (36.5 C) (Oral)  Resp 15  Ht 5\' 1"  (1.549 m)  Wt 159 lb (72.122 kg)  BMI 30.06 kg/m2  SpO2 96% Physical Exam  Nursing note and vitals reviewed. Constitutional: She is oriented to person, place, and time. She appears well-developed and well-nourished. No distress.  HENT:  Head: Normocephalic and atraumatic.  Mouth/Throat: Oropharynx is clear and moist. No oropharyngeal exudate.  Eyes: Conjunctivae and EOM are normal. Right eye exhibits no discharge. Left eye exhibits no discharge.  Neck: Normal range of motion. Neck supple. No tracheal deviation present.  Cardiovascular: Normal rate,  regular rhythm and normal heart sounds.  Exam reveals no friction rub.   No murmur heard. Pulmonary/Chest: Effort normal and breath sounds normal. No respiratory distress. She has no wheezes. She has no rales.  Abdominal: Soft. Normal appearance and bowel sounds are normal. She exhibits no  distension. There is tenderness in the suprapubic area and left lower quadrant. There is no rebound and no guarding.    Negative abdominal distention Bowel sounds normal active in all 4 quadrants Abdomen soft upon palpation Discomfort upon palpation to the left lower quadrant Negative peritoneal signs  Genitourinary:  Pelvic Exam: Negative swelling, erythema, inflammation, lesions, sores to the external genitalia and vaginal canal. Negative blood in the vaginal vault. Thick white discharge identified. Cervix identify what negative friability or lesions identified. Positive CMT tenderness and bilateral adnexal tenderness. Exam chaperoned with tech.  Musculoskeletal: Normal range of motion.  Lymphadenopathy:    She has no cervical adenopathy.  Neurological: She is alert and oriented to person, place, and time. No cranial nerve deficit. She exhibits normal muscle tone. Coordination normal.  Skin: Skin is warm and dry. No rash noted. She is not diaphoretic. No erythema.  Psychiatric: She has a normal mood and affect. Her behavior is normal. Thought content normal.    ED Course  Procedures (including critical care time) Labs Review Labs Reviewed  WET PREP, GENITAL  GC/CHLAMYDIA PROBE AMP  CBC WITH DIFFERENTIAL  COMPREHENSIVE METABOLIC PANEL  LIPASE, BLOOD  URINALYSIS, ROUTINE W REFLEX MICROSCOPIC  POC URINE PREG, ED    Imaging Review US Transvaginal Non-ob  12/14/2013   CLINICAL DATA:  Pelvic pain. History of right salpingectomy and left hydrosalpinx. History of uterine ablation.  EXAM: TRANSABDOMINAL AND TRANSVAGINAL ULTRASOUND OF PELVIS  DOPPLER ULTRASOUND OF OVARIES  TECHNIQUE: Both transabdominal and transvaginal ultrasound examinations of the pelvis were performed. Transabdominal technique was performed for global imaging of the pelvis including uterus, ovaries, adnexal regions, and pelvic cul-de-sac.  It was necessary to proceed with endovaginal exam following the transabdominal  exam to visualize the ovaries. Color and duplex Doppler ultrasound was utilized to evaluate blood flow to the ovaries.  COMPARISON:  11/22/2013  FINDINGS: Uterus  Measurements: 8 x 4 x 5 cm. No fibroids or other mass visualized.  Endometrium  Thickness: 3 mm were visualized. The endometrium is indistinct in the setting of reported uterine ablation.  Right ovary  Measurements: 1.8 x 1.7 x 1.5 cm. Normal appearance/no adnexal mass.  Left ovary  Measurements: 2.4 x 2.2 x 2.8 cm. Previously noted left-sided hydrosalpinx is not visualized. There is a 2.2 cm partly cystic structure within the left ovary which has nonvascular septations, mid level echoes, and cystic spaces. The complex portion is surrounded by cresentic fluid, likely reflecting retracting clot. The given there was no similar appearing mass on sonography 1 month prior, consistent with hemorrhagic cyst rather cystic neoplasm.  Pulsed Doppler evaluation of both ovaries demonstrates normal low-resistance arterial and venous waveforms.  Other findings  No free fluid.  IMPRESSION: 1. No evidence ovarian torsion or other acute finding. 2. 2.2 cm hemorrhagic cyst in the left ovary. 3. Left hydrosalpinx no longer visualized.   Electronically Signed   By: Jorje Guild M.D.   On: 12/14/2013 23:46   US Pelvis Complete  12/14/2013   CLINICAL DATA:  Pelvic pain. History of right salpingectomy and left hydrosalpinx. History of uterine ablation.  EXAM: TRANSABDOMINAL AND TRANSVAGINAL ULTRASOUND OF PELVIS  DOPPLER ULTRASOUND OF OVARIES  TECHNIQUE: Both transabdominal and transvaginal ultrasound examinations  of the pelvis were performed. Transabdominal technique was performed for global imaging of the pelvis including uterus, ovaries, adnexal regions, and pelvic cul-de-sac.  It was necessary to proceed with endovaginal exam following the transabdominal exam to visualize the ovaries. Color and duplex Doppler ultrasound was utilized to evaluate blood flow to the  ovaries.  COMPARISON:  11/22/2013  FINDINGS: Uterus  Measurements: 8 x 4 x 5 cm. No fibroids or other mass visualized.  Endometrium  Thickness: 3 mm were visualized. The endometrium is indistinct in the setting of reported uterine ablation.  Right ovary  Measurements: 1.8 x 1.7 x 1.5 cm. Normal appearance/no adnexal mass.  Left ovary  Measurements: 2.4 x 2.2 x 2.8 cm. Previously noted left-sided hydrosalpinx is not visualized. There is a 2.2 cm partly cystic structure within the left ovary which has nonvascular septations, mid level echoes, and cystic spaces. The complex portion is surrounded by cresentic fluid, likely reflecting retracting clot. The given there was no similar appearing mass on sonography 1 month prior, consistent with hemorrhagic cyst rather cystic neoplasm.  Pulsed Doppler evaluation of both ovaries demonstrates normal low-resistance arterial and venous waveforms.  Other findings  No free fluid.  IMPRESSION: 1. No evidence ovarian torsion or other acute finding. 2. 2.2 cm hemorrhagic cyst in the left ovary. 3. Left hydrosalpinx no longer visualized.   Electronically Signed   By: Jorje Guild M.D.   On: 12/14/2013 23:46   [place] Art/ven Flow Abd Pelv Doppler  12/14/2013   CLINICAL DATA:  Pelvic pain. History of right salpingectomy and left hydrosalpinx. History of uterine ablation.  EXAM: TRANSABDOMINAL AND TRANSVAGINAL ULTRASOUND OF PELVIS  DOPPLER ULTRASOUND OF OVARIES  TECHNIQUE: Both transabdominal and transvaginal ultrasound examinations of the pelvis were performed. Transabdominal technique was performed for global imaging of the pelvis including uterus, ovaries, adnexal regions, and pelvic cul-de-sac.  It was necessary to proceed with endovaginal exam following the transabdominal exam to visualize the ovaries. Color and duplex Doppler ultrasound was utilized to evaluate blood flow to the ovaries.  COMPARISON:  11/22/2013  FINDINGS: Uterus  Measurements: 8 x 4 x 5 cm. No fibroids or other  mass visualized.  Endometrium  Thickness: 3 mm were visualized. The endometrium is indistinct in the setting of reported uterine ablation.  Right ovary  Measurements: 1.8 x 1.7 x 1.5 cm. Normal appearance/no adnexal mass.  Left ovary  Measurements: 2.4 x 2.2 x 2.8 cm. Previously noted left-sided hydrosalpinx is not visualized. There is a 2.2 cm partly cystic structure within the left ovary which has nonvascular septations, mid level echoes, and cystic spaces. The complex portion is surrounded by cresentic fluid, likely reflecting retracting clot. The given there was no similar appearing mass on sonography 1 month prior, consistent with hemorrhagic cyst rather cystic neoplasm.  Pulsed Doppler evaluation of both ovaries demonstrates normal low-resistance arterial and venous waveforms.  Other findings  No free fluid.  IMPRESSION: 1. No evidence ovarian torsion or other acute finding. 2. 2.2 cm hemorrhagic cyst in the left ovary. 3. Left hydrosalpinx no longer visualized.   Electronically Signed   By: Jorje Guild M.D.   On: 12/14/2013 23:46     EKG Interpretation None      MDM   Final diagnoses:  None    Medications  morphine 4 MG/ML injection 4 mg (4 mg Intravenous Given 12/14/13 2358)  sodium chloride 0.9 % bolus 1,000 mL (1,000 mLs Intravenous New Bag/Given 12/14/13 2358)   Filed Vitals:   12/14/13 1928 12/14/13 2307  BP: 127/79 125/63  Pulse: 69 63  Temp: 97.7 F (36.5 C)   TempSrc: Oral   Resp: 18 15  Height: 5\' 1"  (1.549 m)   Weight: 159 lb (72.122 kg)   SpO2: 100% 96%   This provider reviewed patient's chart. Patient was seen and assessed in ED setting on 11/21/2013 regarding left-sided pelvic discomfort. Patient had imaging performed identified a left hydrosalpinx. Patient was discharged with doxycycline and Flagyl. Patient was followed with her primary care provider this past Monday. Patient has an appointment with women's outpatient clinic on 01/03/2014. CBC unremarkable. CMP  unremarkable. Lipase negative elevation. Urine pregnancy negative. Urinalysis unremarkable. Wet prep unremarkable. GC Chlamydia probe pending. Ultrasounds of pelvis negative acute abnormalities. Left hydrosalpinx is normal the visualized. Left hemorrhagic cyst in the left ovary noted - possible suspicion of patient's pain.  CT abdomen and pelvic with contrast ordered and pending. Discussed case with Antonietta Breach, PA-C - transfer of care to [facility] PA-C at change in shift.   Jamse Mead, PA-C 12/15/13 [phone]

## 2013-12-15 ENCOUNTER — Emergency Department (HOSPITAL_COMMUNITY): Payer: PRIVATE HEALTH INSURANCE

## 2013-12-15 ENCOUNTER — Encounter (HOSPITAL_COMMUNITY): Payer: Self-pay

## 2013-12-15 LAB — COMPREHENSIVE METABOLIC PANEL
ALT: 24 U/L (ref 0–35)
ANION GAP: 13 (ref 5–15)
AST: 20 U/L (ref 0–37)
Albumin: 3.8 g/dL (ref 3.5–5.2)
Alkaline Phosphatase: 80 U/L (ref 39–117)
BUN: 9 mg/dL (ref 6–23)
CALCIUM: 9.3 mg/dL (ref 8.4–10.5)
CO2: 22 meq/L (ref 19–32)
CREATININE: 0.67 mg/dL (ref 0.50–1.10)
Chloride: 103 mEq/L (ref 96–112)
GFR calc Af Amer: >90 mL/min (ref >90)
Glucose, Bld: 98 mg/dL (ref 70–99)
Potassium: 3.9 mEq/L (ref 3.7–5.3)
Sodium: 138 mEq/L (ref 137–147)
Total Bilirubin: 0.4 mg/dL (ref 0.3–1.2)
Total Protein: 6.8 g/dL (ref 6.0–8.3)

## 2013-12-15 LAB — CBC WITH DIFFERENTIAL/PLATELET
BASOS PCT: 0 % (ref 0–1)
Basophils Absolute: 0 10*3/uL (ref 0.0–0.1)
EOS PCT: 3 % (ref 0–5)
Eosinophils Absolute: 0.2 10*3/uL (ref 0.0–0.7)
HEMATOCRIT: 42.1 % (ref 36.0–46.0)
Hemoglobin: 14.4 g/dL (ref 12.0–15.0)
LYMPHS PCT: 36 % (ref 12–46)
Lymphs Abs: 2.9 10*3/uL (ref 0.7–4.0)
MCH: 29.5 pg (ref 26.0–34.0)
MCHC: 34.2 g/dL (ref 30.0–36.0)
MCV: 86.3 fL (ref 78.0–100.0)
MONO ABS: 0.7 10*3/uL (ref 0.1–1.0)
Monocytes Relative: 9 % (ref 3–12)
Neutro Abs: 4.2 10*3/uL (ref 1.7–7.7)
Neutrophils Relative %: 52 % (ref 43–77)
Platelets: 237 10*3/uL (ref 150–400)
RBC: 4.88 MIL/uL (ref 3.87–5.11)
RDW: 12.8 % (ref 11.5–15.5)
WBC: 8.1 10*3/uL (ref 4.0–10.5)

## 2013-12-15 LAB — LIPASE, BLOOD: LIPASE: 32 U/L (ref 11–59)

## 2013-12-15 LAB — WET PREP, GENITAL
Clue Cells Wet Prep HPF POC: NONE SEEN
Trich, Wet Prep: NONE SEEN
WBC, Wet Prep HPF POC: NONE SEEN
Yeast Wet Prep HPF POC: NONE SEEN

## 2013-12-15 LAB — GC/CHLAMYDIA PROBE AMP
CT Probe RNA: NEGATIVE
GC Probe RNA: NEGATIVE

## 2013-12-15 MED ORDER — MORPHINE SULFATE 4 MG/ML IJ SOLN
4.0000 mg | Freq: Once | INTRAMUSCULAR | Status: DC
  Filled 2013-12-15: qty 1

## 2013-12-15 MED ORDER — IOHEXOL 300 MG/ML  SOLN
50.0000 mL | Freq: Once | INTRAMUSCULAR | Status: AC | PRN
  Administered 2013-12-15: 50 mL via ORAL

## 2013-12-15 MED ORDER — OXYCODONE-ACETAMINOPHEN 5-325 MG PO TABS
2.0000 | ORAL_TABLET | Freq: Once | ORAL | Status: DC
  Filled 2013-12-15: qty 2

## 2013-12-15 MED ORDER — HYDROCODONE-ACETAMINOPHEN 5-325 MG PO TABS: 1.0000 | ORAL_TABLET | Freq: Four times a day (QID) | ORAL | Status: DC | PRN

## 2013-12-15 MED ORDER — IOHEXOL 300 MG/ML  SOLN
100.0000 mL | Freq: Once | INTRAMUSCULAR | Status: AC | PRN
  Administered 2013-12-15: 100 mL via INTRAVENOUS

## 2013-12-15 NOTE — Discharge Instructions (Signed)
Ovarian Cyst An ovarian cyst is a fluid-filled sac that forms on an ovary. The ovaries are small organs that produce eggs in women. Various types of cysts can form on the ovaries. Most are not cancerous. Many do not cause problems, and they often go away on their own. Some may cause symptoms and require treatment. Common types of ovarian cysts include:  Functional cysts--These cysts may occur every month during the menstrual cycle. This is normal. The cysts usually go away with the next menstrual cycle if the woman does not get pregnant. Usually, there are no symptoms with a functional cyst.  Endometrioma cysts--These cysts form from the tissue that lines the uterus. They are also called "chocolate cysts" because they become filled with blood that turns brown. This type of cyst can cause pain in the lower abdomen during intercourse and with your menstrual period.  Cystadenoma cysts--This type develops from the cells on the outside of the ovary. These cysts can get very big and cause lower abdomen pain and pain with intercourse. This type of cyst can twist on itself, cut off its blood supply, and cause severe pain. It can also easily rupture and cause a lot of pain.  Dermoid cysts--This type of cyst is sometimes found in both ovaries. These cysts may contain different kinds of body tissue, such as skin, teeth, hair, or cartilage. They usually do not cause symptoms unless they get very big.  Theca lutein cysts--These cysts occur when too much of a certain hormone (human chorionic gonadotropin) is produced and overstimulates the ovaries to produce an egg. This is most common after procedures used to assist with the conception of a baby (in vitro fertilization). CAUSES   Fertility drugs can cause a condition in which multiple large cysts are formed on the ovaries. This is called ovarian hyperstimulation syndrome.  A condition called polycystic ovary syndrome can cause hormonal imbalances that can lead to  nonfunctional ovarian cysts. SIGNS AND SYMPTOMS  Many ovarian cysts do not cause symptoms. If symptoms are present, they may include:  Pelvic pain or pressure.  Pain in the lower abdomen.  Pain during sexual intercourse.  Increasing girth (swelling) of the abdomen.  Abnormal menstrual periods.  Increasing pain with menstrual periods.  Stopping having menstrual periods without being pregnant. DIAGNOSIS  These cysts are commonly found during a routine or annual pelvic exam. Tests may be ordered to find out more about the cyst. These tests may include:  Ultrasound.  X-ray of the pelvis.  CT scan.  MRI.  Blood tests. TREATMENT  Many ovarian cysts go away on their own without treatment. Your health care provider may want to check your cyst regularly for 2-3 months to see if it changes. For women in menopause, it is particularly important to monitor a cyst closely because of the higher rate of ovarian cancer in menopausal women. When treatment is needed, it may include any of the following:  A procedure to drain the cyst (aspiration). This may be done using a long needle and ultrasound. It can also be done through a laparoscopic procedure. This involves using a thin, lighted tube with a tiny camera on the end (laparoscope) inserted through a small incision.  Surgery to remove the whole cyst. This may be done using laparoscopic surgery or an open surgery involving a larger incision in the lower abdomen.  Hormone treatment or birth control pills. These methods are sometimes used to help dissolve a cyst. HOME CARE INSTRUCTIONS   Only take over-the-counter   or prescription medicines as directed by your health care provider.  Follow up with your health care provider as directed.  Get regular pelvic exams and Pap tests. SEEK MEDICAL CARE IF:   Your periods are late, irregular, or painful, or they stop.  Your pelvic pain or abdominal pain does not go away.  Your abdomen becomes  larger or swollen.  You have pressure on your bladder or trouble emptying your bladder completely.  You have pain during sexual intercourse.  You have feelings of fullness, pressure, or discomfort in your stomach.  You lose weight for no apparent reason.  You feel generally ill.  You become constipated.  You lose your appetite.  You develop acne.  You have an increase in body and facial hair.  You are gaining weight, without changing your exercise and eating habits.  You think you are pregnant. SEEK IMMEDIATE MEDICAL CARE IF:   You have increasing abdominal pain.  You feel sick to your stomach (nauseous), and you throw up (vomit).  You develop a fever that comes on suddenly.  You have abdominal pain during a bowel movement.  Your menstrual periods become heavier than usual. MAKE SURE YOU:  Understand these instructions.  Will watch your condition.  Will get help right away if you are not doing well or get worse. Document Released: 03/11/2005 Document Revised: 03/16/2013 Document Reviewed: 11/16/2012 [employer] Patient Information 2015 [employer]. This information is not intended to replace advice given to you by your health care provider. Make sure you discuss any questions you have with your health care provider.  

## 2013-12-15 NOTE — ED Notes (Signed)
Pt returned from CT °

## 2013-12-15 NOTE — ED Notes (Signed)
Patient transported to CT 

## 2013-12-15 NOTE — ED Provider Notes (Signed)
Medical screening examination/treatment/procedure(s) were performed by non-physician practitioner and as supervising physician I was immediately available for consultation/collaboration.   EKG Interpretation None        Merryl Hacker, MD 12/15/13 [phone]

## 2013-12-15 NOTE — ED Provider Notes (Signed)
[age] - 48 year old female presents to the emergency department for further evaluation of abdominal pain. Patient has a history of being treated for hydrosalpinx on 11/21/2013. Patient care assumed from [facility], PA-C at shift change with CT imaging pending. Plan includes disposition based on imaging. Imaging results reviewed. Patient has evidence of a 2.2 cm hemorrhagic cyst on her left ovary. CT suggests left hydrosalpinx; however, is less specific for this than ultrasound. Suspect that symptoms and left lower abdomen are, instead, the results of ruptured ovarian cyst. Labs today without leukocytosis. No WBCs per wet prep. Will treat with percocet for pain control. Patient has been advised to follow up with her OB/GYN to ensure resolution of symptoms. Return precautions discussed and provided. Patient agreeable to plan with no unaddressed concerns. Patient discharged in good condition; VSS.  Filed Vitals:   12/14/13 1928 12/14/13 2307 12/15/13 0120  BP: 127/79 125/63 122/66  Pulse: 69 63 63  Temp: 97.7 F (36.5 C)  97.8 F (36.6 C)  TempSrc: Oral  Oral  Resp: 18 15 18   Height: 5\' 1"  (1.549 m)    Weight: 159 lb (72.122 kg)    SpO2: 100% 96% 95%   Results for orders placed during the hospital encounter of 12/14/13  WET PREP, GENITAL      Result Value Ref Range   Yeast Wet Prep HPF POC NONE SEEN  NONE SEEN   Trich, Wet Prep NONE SEEN  NONE SEEN   Clue Cells Wet Prep HPF POC NONE SEEN  NONE SEEN   WBC, Wet Prep HPF POC NONE SEEN  NONE SEEN  CBC WITH DIFFERENTIAL      Result Value Ref Range   WBC 8.1  4.0 - 10.5 K/uL   RBC 4.88  3.87 - 5.11 MIL/uL   Hemoglobin 14.4  12.0 - 15.0 g/dL   HCT 42.1  36.0 - 46.0 %   MCV 86.3  78.0 - 100.0 fL   MCH 29.5  26.0 - 34.0 pg   MCHC 34.2  30.0 - 36.0 g/dL   RDW 12.8  11.5 - 15.5 %   Platelets 237  150 - 400 K/uL   Neutrophils Relative % 52  43 - 77 %   Neutro Abs 4.2  1.7 - 7.7 K/uL   Lymphocytes Relative 36  12 - 46 %   Lymphs Abs 2.9  0.7  - 4.0 K/uL   Monocytes Relative 9  3 - 12 %   Monocytes Absolute 0.7  0.1 - 1.0 K/uL   Eosinophils Relative 3  0 - 5 %   Eosinophils Absolute 0.2  0.0 - 0.7 K/uL   Basophils Relative 0  0 - 1 %   Basophils Absolute 0.0  0.0 - 0.1 K/uL  COMPREHENSIVE METABOLIC PANEL      Result Value Ref Range   Sodium 138  137 - 147 mEq/L   Potassium 3.9  3.7 - 5.3 mEq/L   Chloride 103  96 - 112 mEq/L   CO2 22  19 - 32 mEq/L   Glucose, Bld 98  70 - 99 mg/dL   BUN 9  6 - 23 mg/dL   Creatinine, Ser 0.67  0.50 - 1.10 mg/dL   Calcium 9.3  8.4 - 10.5 mg/dL   Total Protein 6.8  6.0 - 8.3 g/dL   Albumin 3.8  3.5 - 5.2 g/dL   AST 20  0 - 37 U/L   ALT 24  0 - 35 U/L   Alkaline Phosphatase 80  39 - 117 U/L   Total Bilirubin 0.4  0.3 - 1.2 mg/dL   GFR calc non Af Amer >90  >90 mL/min   GFR calc Af Amer >90  >90 mL/min   Anion gap 13  5 - 15  LIPASE, BLOOD      Result Value Ref Range   Lipase 32  11 - 59 U/L  URINALYSIS, ROUTINE W REFLEX MICROSCOPIC      Result Value Ref Range   Color, Urine YELLOW  YELLOW   APPearance CLEAR  CLEAR   Specific Gravity, Urine 1.028  1.005 - 1.030   pH 6.0  5.0 - 8.0   Glucose, UA NEGATIVE  NEGATIVE mg/dL   Hgb urine dipstick NEGATIVE  NEGATIVE   Bilirubin Urine NEGATIVE  NEGATIVE   Ketones, ur NEGATIVE  NEGATIVE mg/dL   Protein, ur NEGATIVE  NEGATIVE mg/dL   Urobilinogen, UA 0.2  0.0 - 1.0 mg/dL   Nitrite NEGATIVE  NEGATIVE   Leukocytes, UA NEGATIVE  NEGATIVE  POC URINE PREG, ED      Result Value Ref Range   Preg Test, Ur NEGATIVE  NEGATIVE   US Transvaginal Non-ob  12/14/2013   CLINICAL DATA:  Pelvic pain. History of right salpingectomy and left hydrosalpinx. History of uterine ablation.  EXAM: TRANSABDOMINAL AND TRANSVAGINAL ULTRASOUND OF PELVIS  DOPPLER ULTRASOUND OF OVARIES  TECHNIQUE: Both transabdominal and transvaginal ultrasound examinations of the pelvis were performed. Transabdominal technique was performed for global imaging of the pelvis including  uterus, ovaries, adnexal regions, and pelvic cul-de-sac.  It was necessary to proceed with endovaginal exam following the transabdominal exam to visualize the ovaries. Color and duplex Doppler ultrasound was utilized to evaluate blood flow to the ovaries.  COMPARISON:  11/22/2013  FINDINGS: Uterus  Measurements: 8 x 4 x 5 cm. No fibroids or other mass visualized.  Endometrium  Thickness: 3 mm were visualized. The endometrium is indistinct in the setting of reported uterine ablation.  Right ovary  Measurements: 1.8 x 1.7 x 1.5 cm. Normal appearance/no adnexal mass.  Left ovary  Measurements: 2.4 x 2.2 x 2.8 cm. Previously noted left-sided hydrosalpinx is not visualized. There is a 2.2 cm partly cystic structure within the left ovary which has nonvascular septations, mid level echoes, and cystic spaces. The complex portion is surrounded by cresentic fluid, likely reflecting retracting clot. The given there was no similar appearing mass on sonography 1 month prior, consistent with hemorrhagic cyst rather cystic neoplasm.  Pulsed Doppler evaluation of both ovaries demonstrates normal low-resistance arterial and venous waveforms.  Other findings  No free fluid.  IMPRESSION: 1. No evidence ovarian torsion or other acute finding. 2. 2.2 cm hemorrhagic cyst in the left ovary. 3. Left hydrosalpinx no longer visualized.   Electronically Signed   By: Jorje Guild M.D.   On: 12/14/2013 23:46   US Pelvis Complete  12/14/2013   CLINICAL DATA:  Pelvic pain. History of right salpingectomy and left hydrosalpinx. History of uterine ablation.  EXAM: TRANSABDOMINAL AND TRANSVAGINAL ULTRASOUND OF PELVIS  DOPPLER ULTRASOUND OF OVARIES  TECHNIQUE: Both transabdominal and transvaginal ultrasound examinations of the pelvis were performed. Transabdominal technique was performed for global imaging of the pelvis including uterus, ovaries, adnexal regions, and pelvic cul-de-sac.  It was necessary to proceed with endovaginal exam following  the transabdominal exam to visualize the ovaries. Color and duplex Doppler ultrasound was utilized to evaluate blood flow to the ovaries.  COMPARISON:  11/22/2013  FINDINGS: Uterus  Measurements: 8 x 4 x  5 cm. No fibroids or other mass visualized.  Endometrium  Thickness: 3 mm were visualized. The endometrium is indistinct in the setting of reported uterine ablation.  Right ovary  Measurements: 1.8 x 1.7 x 1.5 cm. Normal appearance/no adnexal mass.  Left ovary  Measurements: 2.4 x 2.2 x 2.8 cm. Previously noted left-sided hydrosalpinx is not visualized. There is a 2.2 cm partly cystic structure within the left ovary which has nonvascular septations, mid level echoes, and cystic spaces. The complex portion is surrounded by cresentic fluid, likely reflecting retracting clot. The given there was no similar appearing mass on sonography 1 month prior, consistent with hemorrhagic cyst rather cystic neoplasm.  Pulsed Doppler evaluation of both ovaries demonstrates normal low-resistance arterial and venous waveforms.  Other findings  No free fluid.  IMPRESSION: 1. No evidence ovarian torsion or other acute finding. 2. 2.2 cm hemorrhagic cyst in the left ovary. 3. Left hydrosalpinx no longer visualized.   Electronically Signed   By: Jorje Guild M.D.   On: 12/14/2013 23:46   Ct Abdomen Pelvis W Contrast  12/15/2013   CLINICAL DATA:  Abdominal pain.  EXAM: CT ABDOMEN AND PELVIS WITH CONTRAST  TECHNIQUE: Multidetector CT imaging of the abdomen and pelvis was performed using the standard protocol following bolus administration of intravenous contrast.  CONTRAST:  1101mL OMNIPAQUE IOHEXOL 300 MG/ML  SOLN  COMPARISON:  11/22/2013  FINDINGS: BODY WALL: Unremarkable.  LOWER CHEST: Unremarkable.  ABDOMEN/PELVIS:  Liver: No focal abnormality.  Biliary: No evidence of biliary obstruction or stone.  Pancreas: Unremarkable.  Spleen: Unremarkable.  Adrenals: Unremarkable.  Kidneys and ureters: No hydronephrosis or stone.  Bladder:  Unremarkable.  Reproductive: An approximately 5 cm long tubular structure in the left adnexae is again visualized, more anteriorly located currently, consistent with hydrosalpinx seen on 11/22/2013 pelvic sonography. No associated inflammation or abscess identified.  Bowel: No obstruction. Appendectomy.  Retroperitoneum: No mass or adenopathy.  Peritoneum: No ascites or pneumoperitoneum.  Vascular: No acute abnormality.  OSSEOUS: No acute abnormalities.  IMPRESSION: 1. No acute intra-abdominal findings. 2. Left hydrosalpinx.   Electronically Signed   By: Jorje Guild M.D.   On: 12/15/2013 03:56   [place] Art/ven Flow Abd Pelv Doppler  12/14/2013   CLINICAL DATA:  Pelvic pain. History of right salpingectomy and left hydrosalpinx. History of uterine ablation.  EXAM: TRANSABDOMINAL AND TRANSVAGINAL ULTRASOUND OF PELVIS  DOPPLER ULTRASOUND OF OVARIES  TECHNIQUE: Both transabdominal and transvaginal ultrasound examinations of the pelvis were performed. Transabdominal technique was performed for global imaging of the pelvis including uterus, ovaries, adnexal regions, and pelvic cul-de-sac.  It was necessary to proceed with endovaginal exam following the transabdominal exam to visualize the ovaries. Color and duplex Doppler ultrasound was utilized to evaluate blood flow to the ovaries.  COMPARISON:  11/22/2013  FINDINGS: Uterus  Measurements: 8 x 4 x 5 cm. No fibroids or other mass visualized.  Endometrium  Thickness: 3 mm were visualized. The endometrium is indistinct in the setting of reported uterine ablation.  Right ovary  Measurements: 1.8 x 1.7 x 1.5 cm. Normal appearance/no adnexal mass.  Left ovary  Measurements: 2.4 x 2.2 x 2.8 cm. Previously noted left-sided hydrosalpinx is not visualized. There is a 2.2 cm partly cystic structure within the left ovary which has nonvascular septations, mid level echoes, and cystic spaces. The complex portion is surrounded by cresentic fluid, likely reflecting retracting clot.  The given there was no similar appearing mass on sonography 1 month prior, consistent with hemorrhagic cyst rather cystic  neoplasm.  Pulsed Doppler evaluation of both ovaries demonstrates normal low-resistance arterial and venous waveforms.  Other findings  No free fluid.  IMPRESSION: 1. No evidence ovarian torsion or other acute finding. 2. 2.2 cm hemorrhagic cyst in the left ovary. 3. Left hydrosalpinx no longer visualized.   Electronically Signed   By: Jorje Guild M.D.   On: 12/14/2013 23:46    Antonietta Breach, PA-C 12/15/13 [phone]

## 2013-12-15 NOTE — ED Provider Notes (Signed)
Medical screening examination/treatment/procedure(s) were performed by non-physician practitioner and as supervising physician I was immediately available for consultation/collaboration.   EKG Interpretation None        Merryl Hacker, MD 12/15/13 [phone]

## 2014-01-03 ENCOUNTER — Ambulatory Visit (INDEPENDENT_AMBULATORY_CARE_PROVIDER_SITE_OTHER): Payer: PRIVATE HEALTH INSURANCE | Admitting: Obstetrics & Gynecology

## 2014-01-03 ENCOUNTER — Encounter: Payer: Self-pay | Admitting: Obstetrics & Gynecology

## 2014-01-03 VITALS — BP 128/86 | HR 70 | Ht 61.0 in | Wt 164.5 lb

## 2014-01-03 DIAGNOSIS — N8320 Unspecified ovarian cysts: Secondary | ICD-10-CM

## 2014-01-03 DIAGNOSIS — N83202 Unspecified ovarian cyst, left side: Secondary | ICD-10-CM

## 2014-01-03 MED ORDER — TRAMADOL HCL 50 MG PO TABS: 50.0000 mg | ORAL_TABLET | Freq: Four times a day (QID) | ORAL | Status: DC | PRN

## 2014-01-03 MED ORDER — DICLOFENAC POTASSIUM 50 MG PO TABS: 50.0000 mg | ORAL_TABLET | Freq: Three times a day (TID) | ORAL | Status: DC | PRN

## 2014-01-03 NOTE — Progress Notes (Signed)
Subjective:     Patient ID: Jill Vargas, female   DOB: September 05, 1965, 48 y.o.   MRN: [id number]  HPI Pt reports pain in her left side that extends down into her left leg.  She was seen in the ED and diagnosed with left ov cyst.  She is worried that it is her kidney or related to her ablation.      Review of Systems     Objective:   Physical Exam BP 128/86  Pulse 70  Ht 5\' 1"  (1.549 m)  Wt 164 lb 8 oz (74.617 kg)  BMI 31.10 kg/m2 Pt in NAD Abd: soft, NT; ND GU: EGBUS: no lesions Vagina: no blood in vault Cervix: no lesion; no mucopurulent d/c; no CMT Uterus: small, mobile Adnexa: no masses; tenderness on left side and to a lesser degree on the right side   11/22/2013 EXAM:  TRANSABDOMINAL ULTRASOUND OF PELVIS  TECHNIQUE:  Transabdominal ultrasound examination of the pelvis was performed  including evaluation of the uterus, ovaries, adnexal regions, and  pelvic cul-de-sac.  COMPARISON: None.  FINDINGS:  Uterus  Measurements: 8.1 x 3.8 x 4.7 cm. No fibroids or other mass  visualized. No made of a subcentimeter nabothian cyst.  Endometrium  Thickness: 3.4 mm. No focal abnormality visualized.  Right ovary  Measurements: 2.6 x 1.2 x 1.6 cm. Normal appearance. 1.1 x 1.2 x 0.9  cm anechoic cystic lesion likely represents a normal follicle.  Left ovary  Measurements: 2.5 x 1.2 x 1.3 cm. Normal appearance. 0.8 x 1.0 x 0.9  cm cystic lesion within the left ovary likely represents a normal  follicle.  Other findings: Tubular fluid filled anechoic structure within the  left adnexal a was present, most compatible with hydrosalpinx.  Normal thin endosalpingeal folds visualized. No associated  inflammatory changes to suggest superimposed infection or other  abnormality. No internal vascularity.  Trace free pelvic fluid noted within the right adnexa, likely  physiologic.  IMPRESSION:  1. Findings most consistent with left hydrosalpinx. No sonographic  evidence of superimposed  infection or other abnormality.  2. Normal sonographic appearance of the uterus and ovaries.    12/14/2013 TECHNIQUE:  Both transabdominal and transvaginal ultrasound examinations of the  pelvis were performed. Transabdominal technique was performed for  global imaging of the pelvis including uterus, ovaries, adnexal  regions, and pelvic cul-de-sac.  It was necessary to proceed with endovaginal exam following the  transabdominal exam to visualize the ovaries. Color and duplex  Doppler ultrasound was utilized to evaluate blood flow to the  ovaries.  COMPARISON: 11/22/2013  FINDINGS:  Uterus  Measurements: 8 x 4 x 5 cm. No fibroids or other mass visualized.  Endometrium  Thickness: 3 mm were visualized. The endometrium is indistinct in  the setting of reported uterine ablation.  Right ovary  Measurements: 1.8 x 1.7 x 1.5 cm. Normal appearance/no adnexal mass.  Left ovary  Measurements: 2.4 x 2.2 x 2.8 cm. Previously noted left-sided  hydrosalpinx is not visualized. There is a 2.2 cm partly cystic  structure within the left ovary which has nonvascular septations,  mid level echoes, and cystic spaces. The complex portion is  surrounded by cresentic fluid, likely reflecting retracting clot.  The given there was no similar appearing mass on sonography 1 month  prior, consistent with hemorrhagic cyst rather cystic neoplasm.  Pulsed Doppler evaluation of both ovaries demonstrates normal  low-resistance arterial and venous waveforms.  Other findings  No free fluid.  IMPRESSION:  1. No evidence ovarian torsion  or other acute finding.  2. 2.2 cm hemorrhagic cyst in the left ovary.  3. Left hydrosalpinx no longer visualized.        Assessment:     Left ovarian cyst- suspect benign. Reviewed pathophysiology of ov cysts    Plan:     F/u in 6 weeks Cataflam 50mg  po tid with food prn Ultram 50mg  1 po q 6 hours prn pain

## 2014-01-03 NOTE — Patient Instructions (Signed)
Ovarian Cyst An ovarian cyst is a fluid-filled sac that forms on an ovary. The ovaries are small organs that produce eggs in women. Various types of cysts can form on the ovaries. Most are not cancerous. Many do not cause problems, and they often go away on their own. Some may cause symptoms and require treatment. Common types of ovarian cysts include:  Functional cysts--These cysts may occur every month during the menstrual cycle. This is normal. The cysts usually go away with the next menstrual cycle if the woman does not get pregnant. Usually, there are no symptoms with a functional cyst.  Endometrioma cysts--These cysts form from the tissue that lines the uterus. They are also called "chocolate cysts" because they become filled with blood that turns brown. This type of cyst can cause pain in the lower abdomen during intercourse and with your menstrual period.  Cystadenoma cysts--This type develops from the cells on the outside of the ovary. These cysts can get very big and cause lower abdomen pain and pain with intercourse. This type of cyst can twist on itself, cut off its blood supply, and cause severe pain. It can also easily rupture and cause a lot of pain.  Dermoid cysts--This type of cyst is sometimes found in both ovaries. These cysts may contain different kinds of body tissue, such as skin, teeth, hair, or cartilage. They usually do not cause symptoms unless they get very big.  Theca lutein cysts--These cysts occur when too much of a certain hormone (human chorionic gonadotropin) is produced and overstimulates the ovaries to produce an egg. This is most common after procedures used to assist with the conception of a baby (in vitro fertilization). CAUSES   Fertility drugs can cause a condition in which multiple large cysts are formed on the ovaries. This is called ovarian hyperstimulation syndrome.  A condition called polycystic ovary syndrome can cause hormonal imbalances that can lead to  nonfunctional ovarian cysts. SIGNS AND SYMPTOMS  Many ovarian cysts do not cause symptoms. If symptoms are present, they may include:  Pelvic pain or pressure.  Pain in the lower abdomen.  Pain during sexual intercourse.  Increasing girth (swelling) of the abdomen.  Abnormal menstrual periods.  Increasing pain with menstrual periods.  Stopping having menstrual periods without being pregnant. DIAGNOSIS  These cysts are commonly found during a routine or annual pelvic exam. Tests may be ordered to find out more about the cyst. These tests may include:  Ultrasound.  X-ray of the pelvis.  CT scan.  MRI.  Blood tests. TREATMENT  Many ovarian cysts go away on their own without treatment. Your health care provider may want to check your cyst regularly for 2-3 months to see if it changes. For women in menopause, it is particularly important to monitor a cyst closely because of the higher rate of ovarian cancer in menopausal women. When treatment is needed, it may include any of the following:  A procedure to drain the cyst (aspiration). This may be done using a long needle and ultrasound. It can also be done through a laparoscopic procedure. This involves using a thin, lighted tube with a tiny camera on the end (laparoscope) inserted through a small incision.  Surgery to remove the whole cyst. This may be done using laparoscopic surgery or an open surgery involving a larger incision in the lower abdomen.  Hormone treatment or birth control pills. These methods are sometimes used to help dissolve a cyst. HOME CARE INSTRUCTIONS   Only take over-the-counter   or prescription medicines as directed by your health care provider.  Follow up with your health care provider as directed.  Get regular pelvic exams and Pap tests. SEEK MEDICAL CARE IF:   Your periods are late, irregular, or painful, or they stop.  Your pelvic pain or abdominal pain does not go away.  Your abdomen becomes  larger or swollen.  You have pressure on your bladder or trouble emptying your bladder completely.  You have pain during sexual intercourse.  You have feelings of fullness, pressure, or discomfort in your stomach.  You lose weight for no apparent reason.  You feel generally ill.  You become constipated.  You lose your appetite.  You develop acne.  You have an increase in body and facial hair.  You are gaining weight, without changing your exercise and eating habits.  You think you are pregnant. SEEK IMMEDIATE MEDICAL CARE IF:   You have increasing abdominal pain.  You feel sick to your stomach (nauseous), and you throw up (vomit).  You develop a fever that comes on suddenly.  You have abdominal pain during a bowel movement.  Your menstrual periods become heavier than usual. MAKE SURE YOU:  Understand these instructions.  Will watch your condition.  Will get help right away if you are not doing well or get worse. Document Released: 03/11/2005 Document Revised: 03/16/2013 Document Reviewed: 11/16/2012 ExitCare Patient Information 2015 [employer]. This information is not intended to replace advice given to you by your health care provider. Make sure you discuss any questions you have with your health care provider.  

## 2014-01-24 ENCOUNTER — Encounter: Payer: Self-pay | Admitting: Obstetrics & Gynecology

## 2014-04-20 ENCOUNTER — Emergency Department (HOSPITAL_COMMUNITY): Payer: PRIVATE HEALTH INSURANCE

## 2014-04-20 ENCOUNTER — Emergency Department (HOSPITAL_COMMUNITY)
Admission: EM | Admit: 2014-04-20 | Discharge: 2014-04-20 | Disposition: A | Discharge status: Home / Self Care | Payer: PRIVATE HEALTH INSURANCE | Attending: Emergency Medicine | Admitting: Emergency Medicine

## 2014-04-20 ENCOUNTER — Encounter (HOSPITAL_COMMUNITY): Payer: Self-pay | Admitting: Emergency Medicine

## 2014-04-20 DIAGNOSIS — Z87891 Personal history of nicotine dependence: Secondary | ICD-10-CM | POA: Diagnosis not present

## 2014-04-20 DIAGNOSIS — M545 Low back pain, unspecified: Secondary | ICD-10-CM

## 2014-04-20 DIAGNOSIS — Z3202 Encounter for pregnancy test, result negative: Secondary | ICD-10-CM | POA: Diagnosis not present

## 2014-04-20 DIAGNOSIS — Z8639 Personal history of other endocrine, nutritional and metabolic disease: Secondary | ICD-10-CM | POA: Insufficient documentation

## 2014-04-20 DIAGNOSIS — Z792 Long term (current) use of antibiotics: Secondary | ICD-10-CM | POA: Diagnosis not present

## 2014-04-20 DIAGNOSIS — Z8719 Personal history of other diseases of the digestive system: Secondary | ICD-10-CM | POA: Diagnosis not present

## 2014-04-20 DIAGNOSIS — R102 Pelvic and perineal pain: Secondary | ICD-10-CM

## 2014-04-20 DIAGNOSIS — Z9079 Acquired absence of other genital organ(s): Secondary | ICD-10-CM | POA: Diagnosis not present

## 2014-04-20 DIAGNOSIS — G8929 Other chronic pain: Secondary | ICD-10-CM | POA: Diagnosis not present

## 2014-04-20 DIAGNOSIS — Z79899 Other long term (current) drug therapy: Secondary | ICD-10-CM | POA: Insufficient documentation

## 2014-04-20 DIAGNOSIS — N7011 Chronic salpingitis: Secondary | ICD-10-CM | POA: Insufficient documentation

## 2014-04-20 HISTORY — DX: Dorsalgia, unspecified: M54.9

## 2014-04-20 HISTORY — DX: Other chronic pain: G89.29

## 2014-04-20 HISTORY — DX: Pelvic and perineal pain: R10.2

## 2014-04-20 HISTORY — DX: Chronic salpingitis: N70.11

## 2014-04-20 LAB — URINALYSIS, ROUTINE W REFLEX MICROSCOPIC
Bilirubin Urine: NEGATIVE
Glucose, UA: NEGATIVE mg/dL
Hgb urine dipstick: NEGATIVE
KETONES UR: NEGATIVE mg/dL
NITRITE: NEGATIVE
PH: 5 (ref 5.0–8.0)
PROTEIN: NEGATIVE mg/dL
Specific Gravity, Urine: 1.024 (ref 1.005–1.030)
UROBILINOGEN UA: 0.2 mg/dL (ref 0.0–1.0)

## 2014-04-20 LAB — WET PREP, GENITAL
Clue Cells Wet Prep HPF POC: NONE SEEN
Trich, Wet Prep: NONE SEEN
WBC, Wet Prep HPF POC: NONE SEEN
Yeast Wet Prep HPF POC: NONE SEEN

## 2014-04-20 LAB — PREGNANCY, URINE: Preg Test, Ur: NEGATIVE

## 2014-04-20 LAB — URINE MICROSCOPIC-ADD ON

## 2014-04-20 MED ORDER — DOXYCYCLINE HYCLATE 100 MG PO TABS: 100.0000 mg | ORAL_TABLET | Freq: Two times a day (BID) | ORAL | Status: DC

## 2014-04-20 MED ORDER — KETOROLAC TROMETHAMINE 60 MG/2ML IM SOLN
60.0000 mg | Freq: Once | INTRAMUSCULAR | Status: AC
  Administered 2014-04-20: 60 mg via INTRAMUSCULAR
  Filled 2014-04-20: qty 2

## 2014-04-20 MED ORDER — LIDOCAINE HCL 1 % IJ SOLN
INTRAMUSCULAR | Status: AC
  Administered 2014-04-20: 20 mL
  Filled 2014-04-20: qty 20

## 2014-04-20 MED ORDER — HYDROCODONE-ACETAMINOPHEN 5-325 MG PO TABS: ORAL_TABLET | ORAL | Status: DC

## 2014-04-20 MED ORDER — CEFTRIAXONE SODIUM 250 MG IJ SOLR
250.0000 mg | Freq: Once | INTRAMUSCULAR | Status: AC
  Administered 2014-04-20: 250 mg via INTRAMUSCULAR
  Filled 2014-04-20: qty 250

## 2014-04-20 MED ORDER — AZITHROMYCIN 250 MG PO TABS
1000.0000 mg | ORAL_TABLET | Freq: Once | ORAL | Status: AC
  Administered 2014-04-20: 1000 mg via ORAL
  Filled 2014-04-20: qty 4

## 2014-04-20 MED ORDER — MORPHINE SULFATE 4 MG/ML IJ SOLN
4.0000 mg | Freq: Once | INTRAMUSCULAR | Status: AC
  Administered 2014-04-20: 4 mg via INTRAMUSCULAR
  Filled 2014-04-20: qty 1

## 2014-04-20 NOTE — Discharge Instructions (Signed)
°Emergency Department Resource Guide °1) Find a Doctor and Pay Out of Pocket °Although you won't have to find out who is covered by your insurance plan, it is a good idea to ask around and get recommendations. You will then need to call the office and see if the doctor you have chosen will accept you as a new patient and what types of options they offer for patients who are self-pay. Some doctors offer discounts or will set up payment plans for their patients who do not have insurance, but you will need to ask so you aren't surprised when you get to your appointment. ° °2) Contact Your Local Health Department °Not all health departments have doctors that can see patients for sick visits, but many do, so it is worth a call to see if yours does. If you don't know where your local health department is, you can check in your phone book. The CDC also has a tool to help you locate your state's health department, and many state websites also have listings of all of their local health departments. ° °3) Find a Walk-in Clinic °If your illness is not likely to be very severe or complicated, you may want to try a walk in clinic. These are popping up all over the country in pharmacies, drugstores, and shopping centers. They're usually staffed by nurse practitioners or physician assistants that have been trained to treat common illnesses and complaints. They're usually fairly quick and inexpensive. However, if you have serious medical issues or chronic medical problems, these are probably not your best option. ° °No Primary Care Doctor: °- Call [facility] at  832-8000 - they can help you locate a primary care doctor that  accepts your insurance, provides certain services, etc. °- Physician Referral Service- 1-800-533-3463 ° °Chronic Pain Problems: °Organization         Address  Phone   Notes  °[facility]  (336) [phone] Patients need to be referred by their primary care doctor.  ° °Medication  Assistance: °Organization         Address  Phone   Notes  °[place] [facility] [address] °[place], [place] [address] (336) 641-8030 --Must be a resident of [place] °-- Must have NO insurance coverage whatsoever (no Medicaid/ Medicare, etc.) °-- The pt. MUST have a primary care doctor that directs their care regularly and follows them in the community °  °[facility]  (866) 331-1348   °[employer]  (888) 892-1162   ° °Agencies that provide inexpensive medical care: °Organization         Address  Phone   Notes  °[facility]  (336) 832-8035   °[facility]    (336) 832-7272   °[facility] [address] °[place], [place] [address] (336) 832-4777   °[facility] [address], °[place] (336) 271-4999   °[facility]    (336) 373-0678   °[facility]    (336) 272-1050   °[facility] ° [address], [place] Phone:  (336) 832-4444, Fax:  (336) 832-4440 Hours of Operation:  9 am - 6 pm, M-F.  Also accepts Medicaid/Medicare and self-pay.  °[facility] ° [address], [place] Phone: (336) [phone], Fax: (336) [phone]. Hours of Operation:  8:30 am - 5:30 pm, M-F.  Also accepts Medicaid and self-pay.  °[facility] [place] [address], [place] Phone: (336) 878-6027   °[facility] [address], [place], [place] (336)723-1848, Ext. 123 Mondays & Thursdays: 7-9 AM.  First 15 patients are seen on a first come, first serve basis. °  ° °Medicaid-accepting [place] Providers: ° °Organization         Address  Phone   Notes  °[facility] [address], [place] (336) 641-2100 Also accepts self-pay patients.  °[facility] [address], [place] ° (336) 856-9996   °[facility] [address], [place]  (336) 288-8857   °[facility] [address] [place] [address], [place] (336) 299-7000   °Veita Bland [address], [place]  ° (336) 373-1557 Only accepts [place] Access Medicaid patients after they have their name applied to their card.  ° °Self-Pay (no insurance) in [place]: ° °Organization         Address  Phone   Notes  °Sickle Cell Patients, [facility] [address], [place] (336) 832-1970   °[facility] [address], [place] (336) 832-4400   °[facility] ° [address], [place] (336) 992-4800   °[facility]/Dr. Osei-Bonsu ° [address] [place] [address], [place] or [address], [place] (336) [phone] Phone number for both [place] and [place] locations is the same.  °[facility] [address], [place] (336) 299-0000   °[facility] [address] [place] [address], [place] or [address] (336) 852-7530 °(336) 878-2260   °[facility] [address], [place] (336) 350-1642, phone; (336) 294-5005, fax Sees patients 1st and 3rd Saturday of every month.  Must not qualify for public or private insurance (i.e. Medicaid, Medicare, [place] Health Choice, Veterans' Benefits) • Household income should be no more than 200% of the poverty level •The clinic cannot treat you if you are pregnant or think you are pregnant • Sexually transmitted diseases are not treated at the clinic.  ° ° °Dental Care: °Organization         Address  Phone  Notes  °[place] [facility] [address], [place] (336) 641-6152 Accepts children up to age 21 who are enrolled in Medicaid or [place] Health Choice; pregnant women with a Medicaid card; and children who have applied for Medicaid or [place] Health Choice, but were declined, whose parents can pay a reduced fee at time of service.  °[place]  [facility] [place]  [address], [place] (336) 641-7733 Accepts children up to age 21 who are enrolled in Medicaid or [place] Health Choice; pregnant women with a Medicaid card; and children who have applied for Medicaid or [place] Health Choice, but were declined, whose parents can pay a reduced fee at time of service.  °[facility] ° [address],  (336) 641-4533 Patients are seen by appointment only. Walk-ins are not accepted. [facility] will see patients 18 years of age and older. °Monday - Tuesday (8am-5pm) °Most Wednesdays (8:30-5pm) °$30 per visit, cash only  °[facility] ° [address], [place] (336) 641-4533 Patients are seen by appointment only. Walk-ins are not accepted. [facility] will see patients 18 years of age and older. °One   Wednesday Evening (Monthly: Volunteer Based).  $30 per visit, cash only  °[facility]  (919) [phone] for adults; Children under age 4, call Graduate Pediatric Dentistry at ([phone]) [phone]. Children aged 4-14, please call ([phone]) [phone] to request a pediatric application. ° Dental services are provided in all areas of dental care including fillings, crowns and bridges, complete and partial dentures, implants, gum treatment, root canals, and extractions. Preventive care is also provided. Treatment is provided to both adults and children. °Patients are selected via a lottery and there is often a waiting list. °  °[facility] [address], °[place] ° (336) 763-8833 [email or web address] °  °[facility] [address], [place], [place] (336)723-1848, Ext. 123 Second and Fourth Thursday of each month, opens at 6:30 AM; Clinic ends at 9 AM.  Patients are seen on a first-come first-served basis, and a limited number are seen during each clinic.  ° °[facility] ° [address], [place], [place] (336) 723-7904    Eligibility Requirements °You must have lived in [place], [place], or [place] counties for at least the last three months. °  You cannot be eligible for state or federal sponsored healthcare insurance, including Veterans Administration, Medicaid, or Medicare. °  You generally cannot be eligible for healthcare insurance through your employer.  °  How to apply: °Eligibility screenings are held every Tuesday and Wednesday afternoon from 1:00 pm until 4:00 pm. You do not need an appointment for the interview!  °[facility] [address], [place], [place] 336-631-2330   °[facility]  336-342-8273   °[place]  336-703-3100   ° [facility]  336-570-6415   ° °Behavioral Health Resources in the Community: °Intensive Outpatient Programs °Organization         Address  Phone  Notes  °[facility] [address], [place], [place] 336-878-6098   °[facility] [address], [place], [place] 336-832-9800   °[facility] [address], [place], [place] ° 336-882-2125   °[facility] [address],  °[place], [place] 1-800-853-5163 or 336-641-4981   °Substance Abuse Resources °Organization         Address  Phone  Notes  °[facility]  336-882-2125   °[facility]  336-784-9470   °[facility]  336-285-9073   °[facility]  336-845-3988   °Residential & Outpatient Substance Abuse Program  1-800-659-3381   °Psychological Services °Organization         Address  Phone  Notes  °[facility]  336- 832-9600   °[facility]  336- 378-7881   °[facility] [address], [place] 1-800-853-5163 or 336-641-4981   ° °Mobile Crisis Teams °Organization         Address  Phone  Notes  °[facility], Mobile Crisis Care Unit  1-877-626-1772   °Assertive °[facility] ° [address]  [place], [place] 336-834-9664   °Sharon DeEsch [address] °[place] 336-554-5454   ° °Self-Help/Support Groups °Organization         Address  Phone             Notes  °[facility] - variety of support groups  336- 373-1402 Call for more information  °Narcotics Anonymous (NA), [facility] [address], °[place]  2 meetings at this location  ° °  Residential Treatment Programs Organization         Address  Phone  Notes  [facility] [address],    [place]  1-937-497-0313   [facility]  [address], [place] [address], [place], [place]   [place], [place] (864)334-5803 Admissions: 8am-3pm M-F  [facility] [address],    [place], [place] 917-915-0569   [facility] [address] [place], [place], [place]   [facility] [address]  [place], [place]   [facility] Intensive Outpatient [address], [place], [place]   [facility] ([place]  [place], [place] 1-540-204-2395 or (410)761-5343   [facility] [address] [place], [place] Accepts Medicaid  [facility] [address],  [place] 1-(417) [phone] Substance Abuse/Addiction Treatment   [facility]         Address  Phone  Notes  [facility]  (479)705-5548   Domenic Schwab, PhD [address] Arlis Porta [place], [place]   325-671-4277 or 703-006-3652   [facility] [place], [place] 769-721-7797   [facility] [address], [place], [place] 865-132-8362 Insurance/Medicaid/sponsorship through [facility] and Families [address]                                    [place], [place] [phone] [place] [address], [place] [phone]    Dr. Adele Schilder  (585)[phone]   [facility] Dept. 1) [address]  2) [place] 3)  [address], [place] [phone]  [phone]   [place] 604-461-8784 or 609-319-6486 (After Hours)      Take the prescriptions as directed.  Apply moist heat or ice to the area(s) of discomfort, for 15 minutes at a time, several times per day for the next few days.  Do not fall asleep on a heating or ice pack.  Call your regular OB/GYN doctor today to schedule a follow up appointment within the next 3 days.  Return to the Emergency Department immediately if worsening.

## 2014-04-20 NOTE — ED Provider Notes (Signed)
CSN: [id number]     Arrival date & time 04/20/14  1116 History   First MD Initiated Contact with Patient 04/20/14 1138     Chief Complaint  Patient presents with  . Back Pain  . Abdominal Pain      HPI  Pt was seen at 1200. Per pt, c/o sudden onset and worsening of persistent right sided low back "pain" that began yesterday.  Pt describes the pain as "stabbing," "aching," and radiating into the right side of her abd.  Has been associated with urinary frequency and right sided abd/pelvic pain. Denies vaginal bleeding/discharge, no dysuria/hematuria, no N/V, no diarrhea, no black or blood in stools, no CP/SOB. Pt was evaluated by her PMD yesterday for these symptoms, was told her "labs and urine were normal," was rx cipro ("because I already took a dose of some old pills because I thought I had a UTI") and was told "if the pain got worse I needed to go to the ER and get an sonogram."    Past Medical History  Diagnosis Date  . Fibroid uterus   . Hyperlipemia   . Constipation   . Headache   . Menorrhagia   . Hydrosalpinx 10/2013    left  . Chronic pelvic pain in female   . Chronic back pain   . Ovarian cyst 11/2013    left   Past Surgical History  Procedure Laterality Date  . Salpingectomy  1998  . Appendectomy    . Dilitation & currettage/hystroscopy with novasure ablation  04/06/2012    Procedure: DILATATION & CURETTAGE/HYSTEROSCOPY WITH NOVASURE ABLATION;  Surgeon: Lavonia Drafts, MD;  Location: [facility];  Service: Gynecology;  Laterality: N/A;   Family History  Problem Relation Age of Onset  . Hypertension Mother   . Diabetes Father    History  Substance Use Topics  . Smoking status: Former Research scientist (life sciences)  . Smokeless tobacco: Never Used  . Alcohol Use: No   OB History    Gravida Para Term Preterm AB TAB SAB Ectopic Multiple Living   3 3 1 2  0 0 0 0 0 3     Review of Systems ROS: Statement: All systems negative except as marked or noted in the HPI; Constitutional:  Negative for fever and chills. ; ; Eyes: Negative for eye pain, redness and discharge. ; ; ENMT: Negative for ear pain, hoarseness, nasal congestion, sinus pressure and sore throat. ; ; Cardiovascular: Negative for chest pain, palpitations, diaphoresis, dyspnea and peripheral edema. ; ; Respiratory: Negative for cough, wheezing and stridor. ; ; Gastrointestinal: +abd pain. Negative for nausea, vomiting, diarrhea, blood in stool, hematemesis, jaundice and rectal bleeding. . ; ; Genitourinary: +urinary frequency. Negative for dysuria, flank pain and hematuria. ; GYN:  No vaginal bleeding, no vaginal discharge, no vulvar pain. ;; ; Musculoskeletal: +LBP. Negative for neck pain. Negative for swelling and trauma.; ; Skin: Negative for pruritus, rash, abrasions, blisters, bruising and skin lesion.; ; Neuro: Negative for headache, lightheadedness and neck stiffness. Negative for weakness, altered level of consciousness , altered mental status, extremity weakness, paresthesias, involuntary movement, seizure and syncope.     Allergies  Review of patient's allergies indicates no known allergies.  Home Medications   Prior to Admission medications   Medication Sig Start Date End Date Taking? Authorizing Provider  Ascorbic Acid (VITAMIN C PO) Take 1 tablet by mouth daily.   Yes Historical Provider, MD  ciprofloxacin (CIPRO) 500 MG tablet Take 500 mg by mouth 2 (two) times daily.  Yes Historical Provider, MD  ondansetron (ZOFRAN-ODT) 8 MG disintegrating tablet Take 8 mg by mouth every 8 (eight) hours as needed for nausea or vomiting.   Yes Historical Provider, MD  OVER THE COUNTER MEDICATION Take 1 tablet by mouth daily. Estrogen Vitamin   Yes Historical Provider, MD  VITAMIN E PO Take 1 tablet by mouth daily.   Yes Historical Provider, MD  diclofenac (CATAFLAM) 50 MG tablet Take 1 tablet (50 mg total) by mouth 3 (three) times daily with meals as needed (moderate pain). Patient not taking: Reported on 04/20/2014  01/03/14   Lavonia Drafts, MD  HYDROcodone-acetaminophen (NORCO/VICODIN) 5-325 MG per tablet Take 1-2 tablets by mouth every 6 (six) hours as needed for moderate pain or severe pain. Patient not taking: Reported on 04/20/2014 12/15/13   Antonietta Breach, PA-C  traMADol (ULTRAM) 50 MG tablet Take 1 tablet (50 mg total) by mouth every 6 (six) hours as needed for severe pain. Patient not taking: Reported on 04/20/2014 01/03/14   Lavonia Drafts, MD   BP 125/73 mmHg  Pulse 65  Temp(Src) 97.5 F (36.4 C) (Oral)  Resp 17  SpO2 99% Physical Exam  1205; Physical examination:  Nursing notes reviewed; Vital signs and O2 SAT reviewed;  Constitutional: Well developed, Well nourished, Well hydrated, In no acute distress; Head:  Normocephalic, atraumatic; Eyes: EOMI, PERRL, No scleral icterus; ENMT: Mouth and pharynx normal, Mucous membranes moist; Neck: Supple, Full range of motion, No lymphadenopathy; Cardiovascular: Regular rate and rhythm, No murmur, rub, or gallop; Respiratory: Breath sounds clear & equal bilaterally, No rales, rhonchi, wheezes.  Speaking full sentences with ease, Normal respiratory effort/excursion; Chest: Nontender, Movement normal; Abdomen: Soft, +RLQ tenderness to palp. No rebound or guarding. Nondistended, Normal bowel sounds; Genitourinary: No CVA tenderness. Pelvic exam performed with permission of pt and female ED tech assist during exam.  External genitalia w/o lesions. Vaginal vault with thick white discharge.  Cervix w/o lesions, not friable, GC/chlam and wet prep obtained and sent to lab.  Bimanual exam w/o CMT, +uterine and bilateral adnexal tenderness.;; Spine:  No midline CS, TS, LS tenderness. +TTP right lumbar paraspinal muscles.;; Extremities: Pulses normal, No tenderness, No edema, No calf edema or asymmetry.; Neuro: AA&Ox3, Major CN grossly intact.  Speech clear. No gross focal motor or sensory deficits in extremities. Climbs on and off stretcher easily by herself.  Gait steady.; Skin: Color normal, Warm, Dry.    ED Course  Procedures     EKG Interpretation None      MDM  MDM Reviewed: previous chart, nursing note and vitals Reviewed previous: ultrasound and labs Interpretation: labs and CT scan      Results for orders placed or performed during the hospital encounter of 04/20/14  Urinalysis, Routine w reflex microscopic  Result Value Ref Range   Color, Urine YELLOW YELLOW   APPearance CLOUDY (A) CLEAR   Specific Gravity, Urine 1.024 1.005 - 1.030   pH 5.0 5.0 - 8.0   Glucose, UA NEGATIVE NEGATIVE mg/dL   Hgb urine dipstick NEGATIVE NEGATIVE   Bilirubin Urine NEGATIVE NEGATIVE   Ketones, ur NEGATIVE NEGATIVE mg/dL   Protein, ur NEGATIVE NEGATIVE mg/dL   Urobilinogen, UA 0.2 0.0 - 1.0 mg/dL   Nitrite NEGATIVE NEGATIVE   Leukocytes, UA SMALL (A) NEGATIVE  Pregnancy, urine  Result Value Ref Range   Preg Test, Ur NEGATIVE NEGATIVE  Urine microscopic-add on  Result Value Ref Range   Squamous Epithelial / LPF FEW (A) RARE   WBC, UA 0-2 <3  WBC/hpf   Bacteria, UA RARE RARE   Ct Abdomen Pelvis Wo Contrast 04/20/2014   CLINICAL DATA:  Right flank pain, right abdominal pain starting yesterday  EXAM: CT ABDOMEN AND PELVIS WITHOUT CONTRAST  TECHNIQUE: Multidetector CT imaging of the abdomen and pelvis was performed following the standard protocol without IV contrast.  COMPARISON:  CT scan 12/15/2013  FINDINGS: Lung bases are unremarkable. Sagittal images of the spine shows degenerative changes thoracolumbar spine. Large anterior osteophytes noted upper endplate of L2 and L3 vertebral bodies. Moderate anterior osteophytes at L4-L5 level.  Unenhanced liver shows no biliary ductal dilatation. No calcified gallstones are noted within gallbladder.  Unenhanced pancreas, spleen and adrenal glands are unremarkable. Unenhanced kidneys are symmetrical in size. No hydronephrosis or hydroureter.  No nephrolithiasis. No calcified ureteral calculi are noted.  Unenhanced uterus and adnexa are unremarkable. Limited assessment of the urinary bladder which is empty. Pelvic phleboliths are stable.  No aortic aneurysm.  No small bowel obstruction. No pericecal inflammation. No ascites or free air. No adenopathy. The patient is status post appendectomy. No adnexal masses noted.  IMPRESSION: 1. No nephrolithiasis.  No hydronephrosis or hydroureter. 2. No calcified ureteral calculi. 3. Status post appendectomy.  No pericecal inflammation. 4. Limited assessment of the urinary bladder which is empty. 5. Mild degenerative changes thoracolumbar spine.   Electronically Signed   By: Lahoma Crocker M.D.   On: 04/20/2014 12:49   US Transvaginal Non-ob 04/20/2014   CLINICAL DATA:  Acute pelvic pain for 2 days in female.  EXAM: TRANSABDOMINAL AND TRANSVAGINAL ULTRASOUND OF PELVIS  DOPPLER ULTRASOUND OF OVARIES  TECHNIQUE: Both transabdominal and transvaginal ultrasound examinations of the pelvis were performed. Transabdominal technique was performed for global imaging of the pelvis including uterus, ovaries, adnexal regions, and pelvic cul-de-sac.  It was necessary to proceed with endovaginal exam following the transabdominal exam to visualize the ovaries. Color and duplex Doppler ultrasound was utilized to evaluate blood flow to the ovaries.  COMPARISON:  Ultrasound of December 14, 2013.  CT scan of same day.  FINDINGS: Uterus  Measurements: 6.9 x 4.9 x 3.7 cm. No fibroids or other mass visualized.  Endometrium  Thickness: 2 mm.  No focal abnormality visualized.  Right ovary  Measurements: 1.8 x 1.7 x 1.4 cm. Normal appearance/no adnexal mass.  Left ovary  Measurements: 1.9 x 1.6 x 1.6 cm. Multiple follicular cysts are noted. Hypoechoic tubular structure is noted in the left adnexal region consistent with hydrosalpinx measuring 5.3 x 1.3 cm.  Pulsed Doppler evaluation of both ovaries demonstrates normal low-resistance arterial and venous waveforms.  Other findings  No free-fluid.   IMPRESSION: Large left-sided hydrosalpinx is noted. No other abnormality seen in the pelvis.   Electronically Signed   By: Sabino Dick M.D.   On: 04/20/2014 15:23    1530:  Hydrosalpinx ON LEFT, no acute findings RIGHT pelvis to account for pt's pain. Will tx for PID. Pt has been laughing, texting on her cellphone and talking with her friend during her ED visit. Appears comfortable, NAD. States she is ready to go home now. Has tol PO well without N/V.  Pt strongly encouraged to f/u with her PMD and OB/GYN doctors for good continuity of care and control of her chronic medical condition (hydrosalpinx). Verb understanding.    Francine Graven, DO 04/23/14 [phone]

## 2014-04-20 NOTE — ED Notes (Signed)
MD at bedside. 

## 2014-04-20 NOTE — ED Notes (Addendum)
Pt c/o lower bilateral back pain radiating to right groin. Was seen at PCP yesterday and was given Ciprofloxacin 500 mg (1 tablet BID). Had blood drawn-no abnormal results. Also had urine tested and MD told patient her urine was "clean." Pain is still persistent. PCP wanted patient to have sonogram completed. Denies fevers/chills. Does report urinary frequency. Denies dysuria or urine color changes. Denies any other abnormal symptoms. Denies hx back surgery, recent injury. No hx kidney stones.

## 2014-04-21 LAB — GC/CHLAMYDIA PROBE AMP (~~LOC~~) NOT AT ARMC
Chlamydia: NEGATIVE
Neisseria Gonorrhea: NEGATIVE

## 2014-04-29 DIAGNOSIS — E782 Mixed hyperlipidemia: Secondary | ICD-10-CM | POA: Insufficient documentation

## 2014-04-29 DIAGNOSIS — R7989 Other specified abnormal findings of blood chemistry: Secondary | ICD-10-CM | POA: Insufficient documentation

## 2014-05-14 ENCOUNTER — Emergency Department (HOSPITAL_COMMUNITY)
Admission: EM | Admit: 2014-05-14 | Discharge: 2014-05-15 | Disposition: A | Discharge status: Home / Self Care | Payer: PRIVATE HEALTH INSURANCE | Attending: Emergency Medicine | Admitting: Emergency Medicine

## 2014-05-14 ENCOUNTER — Encounter (HOSPITAL_COMMUNITY): Payer: Self-pay

## 2014-05-14 DIAGNOSIS — Z79899 Other long term (current) drug therapy: Secondary | ICD-10-CM | POA: Diagnosis not present

## 2014-05-14 DIAGNOSIS — Z8639 Personal history of other endocrine, nutritional and metabolic disease: Secondary | ICD-10-CM | POA: Insufficient documentation

## 2014-05-14 DIAGNOSIS — G8929 Other chronic pain: Secondary | ICD-10-CM | POA: Insufficient documentation

## 2014-05-14 DIAGNOSIS — Z3202 Encounter for pregnancy test, result negative: Secondary | ICD-10-CM | POA: Diagnosis not present

## 2014-05-14 DIAGNOSIS — M25471 Effusion, right ankle: Secondary | ICD-10-CM | POA: Diagnosis not present

## 2014-05-14 DIAGNOSIS — Z8719 Personal history of other diseases of the digestive system: Secondary | ICD-10-CM | POA: Diagnosis not present

## 2014-05-14 DIAGNOSIS — Z87891 Personal history of nicotine dependence: Secondary | ICD-10-CM | POA: Diagnosis not present

## 2014-05-14 DIAGNOSIS — N7011 Chronic salpingitis: Secondary | ICD-10-CM | POA: Diagnosis not present

## 2014-05-14 DIAGNOSIS — R109 Unspecified abdominal pain: Secondary | ICD-10-CM

## 2014-05-14 DIAGNOSIS — M25472 Effusion, left ankle: Secondary | ICD-10-CM | POA: Insufficient documentation

## 2014-05-14 DIAGNOSIS — Z792 Long term (current) use of antibiotics: Secondary | ICD-10-CM | POA: Diagnosis not present

## 2014-05-14 LAB — COMPREHENSIVE METABOLIC PANEL
ALT: 23 U/L (ref 0–35)
AST: 22 U/L (ref 0–37)
Albumin: 4.3 g/dL (ref 3.5–5.2)
Alkaline Phosphatase: 76 U/L (ref 39–117)
Anion gap: 6 (ref 5–15)
BILIRUBIN TOTAL: 0.8 mg/dL (ref 0.3–1.2)
BUN: 14 mg/dL (ref 6–23)
CALCIUM: 9.2 mg/dL (ref 8.4–10.5)
CO2: 26 mmol/L (ref 19–32)
CREATININE: 0.86 mg/dL (ref 0.50–1.10)
Chloride: 106 mmol/L (ref 96–112)
GFR calc Af Amer: >90 mL/min (ref >90)
GFR calc non Af Amer: 79 mL/min — ABNORMAL LOW (ref >90)
Glucose, Bld: 131 mg/dL — ABNORMAL HIGH (ref 70–99)
Potassium: 3.5 mmol/L (ref 3.5–5.1)
Sodium: 138 mmol/L (ref 135–145)
Total Protein: 7 g/dL (ref 6.0–8.3)

## 2014-05-14 LAB — CBC WITH DIFFERENTIAL/PLATELET
Basophils Absolute: 0 10*3/uL (ref 0.0–0.1)
Basophils Relative: 1 % (ref 0–1)
EOS ABS: 0.2 10*3/uL (ref 0.0–0.7)
EOS PCT: 3 % (ref 0–5)
HCT: 41.6 % (ref 36.0–46.0)
Hemoglobin: 14.3 g/dL (ref 12.0–15.0)
LYMPHS ABS: 2.7 10*3/uL (ref 0.7–4.0)
Lymphocytes Relative: 43 % (ref 12–46)
MCH: 29.6 pg (ref 26.0–34.0)
MCHC: 34.4 g/dL (ref 30.0–36.0)
MCV: 86.1 fL (ref 78.0–100.0)
Monocytes Absolute: 0.5 10*3/uL (ref 0.1–1.0)
Monocytes Relative: 8 % (ref 3–12)
Neutro Abs: 2.9 10*3/uL (ref 1.7–7.7)
Neutrophils Relative %: 45 % (ref 43–77)
Platelets: 221 10*3/uL (ref 150–400)
RBC: 4.83 MIL/uL (ref 3.87–5.11)
RDW: 12.5 % (ref 11.5–15.5)
WBC: 6.2 10*3/uL (ref 4.0–10.5)

## 2014-05-14 LAB — URINALYSIS, ROUTINE W REFLEX MICROSCOPIC
Bilirubin Urine: NEGATIVE
Glucose, UA: NEGATIVE mg/dL
Hgb urine dipstick: NEGATIVE
Ketones, ur: NEGATIVE mg/dL
Leukocytes, UA: NEGATIVE
Nitrite: NEGATIVE
PROTEIN: NEGATIVE mg/dL
Specific Gravity, Urine: 1.028 (ref 1.005–1.030)
UROBILINOGEN UA: 0.2 mg/dL (ref 0.0–1.0)
pH: 5.5 (ref 5.0–8.0)

## 2014-05-14 LAB — LIPASE, BLOOD: LIPASE: 35 U/L (ref 11–59)

## 2014-05-14 MED ORDER — SODIUM CHLORIDE 0.9 % IV BOLUS (SEPSIS)
1000.0000 mL | Freq: Once | INTRAVENOUS | Status: AC
  Administered 2014-05-14: 1000 mL via INTRAVENOUS

## 2014-05-14 MED ORDER — HYDROMORPHONE HCL 1 MG/ML IJ SOLN
1.0000 mg | Freq: Once | INTRAMUSCULAR | Status: AC
  Administered 2014-05-14: 1 mg via INTRAVENOUS
  Filled 2014-05-14: qty 1

## 2014-05-14 MED ORDER — ONDANSETRON HCL 4 MG/2ML IJ SOLN
4.0000 mg | Freq: Once | INTRAMUSCULAR | Status: AC
  Administered 2014-05-14: 4 mg via INTRAVENOUS
  Filled 2014-05-14: qty 2

## 2014-05-14 NOTE — ED Notes (Addendum)
Patient reports she has left leg pain and left ankle swelling x 1 week.  She reports she is scheduled to see an OB/GYN next week because her fallopian tubes are causing her to retain water.

## 2014-05-14 NOTE — ED Provider Notes (Signed)
CSN: [id number]     Arrival date & time 05/14/14  2012 History   First MD Initiated Contact with Patient 05/14/14 2143     Chief Complaint  Patient presents with  . Leg Pain     (Consider location/radiation/quality/duration/timing/severity/associated sxs/prior Treatment) HPI Jill Vargas is a 49 -year-old female with past medical history of uterine ablation, hydrosalpinx with associated abdominal pain who presents to the emergency room complaining of abdominal pain. Patient states her pain has been present and constant throughout the past week. Patient states her pain is consistent with pain she has experienced in the past was seen and evaluated for which was attributed to a hydrosalpinx earlier this year. Patient reports associated nausea and several episodes of nonbilious, nonbloody emesis. Patient states her pain is in her left lower quadrant and radiates to her right lower quadrant. Patient also is complaining of bilateral ankle swelling which has been going on for the past month. Patient states her ankle swelling is worsened when her abdominal pain worsens. Patient states throughout the day her ankle swelling worsens, and then when she has her feet up the swelling goes down. She denies erythema, warmth to her ankles.  Past Medical History  Diagnosis Date  . Fibroid uterus   . Hyperlipemia   . Constipation   . Headache   . Menorrhagia   . Hydrosalpinx 10/2013    left  . Chronic pelvic pain in female   . Chronic back pain   . Ovarian cyst 11/2013    left   Past Surgical History  Procedure Laterality Date  . Salpingectomy  1998  . Appendectomy    . Dilitation & currettage/hystroscopy with novasure ablation  04/06/2012    Procedure: DILATATION & CURETTAGE/HYSTEROSCOPY WITH NOVASURE ABLATION;  Surgeon: Lavonia Drafts, MD;  Location: [facility];  Service: Gynecology;  Laterality: N/A;   Family History  Problem Relation Age of Onset  . Hypertension Mother   . Diabetes Father     History  Substance Use Topics  . Smoking status: Former Research scientist (life sciences)  . Smokeless tobacco: Never Used  . Alcohol Use: No   OB History    Gravida Para Term Preterm AB TAB SAB Ectopic Multiple Living   3 3 1 2  0 0 0 0 0 3     Review of Systems  Constitutional: Negative for fever.  HENT: Negative for trouble swallowing.   Eyes: Negative for visual disturbance.  Respiratory: Negative for shortness of breath.   Cardiovascular: Negative for chest pain.  Gastrointestinal: Positive for nausea, vomiting and abdominal pain.  Genitourinary: Negative for dysuria.  Musculoskeletal: Negative for neck pain.       Bilateral ankle swelling  Skin: Negative for rash.  Neurological: Negative for dizziness, weakness and numbness.  Psychiatric/Behavioral: Negative.       Allergies  Review of patient's allergies indicates no known allergies.  Home Medications   Prior to Admission medications   Medication Sig Start Date End Date Taking? Authorizing Provider  Ascorbic Acid (VITAMIN C PO) Take 1 tablet by mouth daily.   Yes Historical Provider, MD  OVER THE COUNTER MEDICATION Take 1 tablet by mouth daily. Estrogen Vitamin   Yes Historical Provider, MD  VITAMIN E PO Take 1 tablet by mouth daily.   Yes Historical Provider, MD  ciprofloxacin (CIPRO) 500 MG tablet Take 500 mg by mouth 2 (two) times daily.    Historical Provider, MD  diclofenac (CATAFLAM) 50 MG tablet Take 1 tablet (50 mg total) by mouth 3 (three) times  daily with meals as needed (moderate pain). Patient not taking: Reported on 04/20/2014 01/03/14   Lavonia Drafts, MD  doxycycline (VIBRA-TABS) 100 MG tablet Take 1 tablet (100 mg total) by mouth 2 (two) times daily. Patient not taking: Reported on 05/14/2014 04/20/14   Francine Graven, DO  HYDROcodone-acetaminophen (NORCO/VICODIN) 5-325 MG per tablet 1 or 2 tabs PO q6 hours prn pain Patient not taking: Reported on 05/14/2014 04/20/14   Francine Graven, DO  traMADol (ULTRAM) 50 MG  tablet Take 1 tablet (50 mg total) by mouth every 6 (six) hours as needed for severe pain. Patient not taking: Reported on 04/20/2014 01/03/14   Lavonia Drafts, MD   BP 116/54 mmHg  Pulse 72  Temp(Src) 97.6 F (36.4 C) (Oral)  Resp 18  SpO2 98% Physical Exam  Constitutional: She is oriented to person, place, and time. She appears well-developed and well-nourished. No distress.  HENT:  Head: Normocephalic and atraumatic.  Mouth/Throat: Oropharynx is clear and moist. No oropharyngeal exudate.  Eyes: Right eye exhibits no discharge. Left eye exhibits no discharge. No scleral icterus.  Neck: Normal range of motion.  Cardiovascular: Normal rate, regular rhythm and normal heart sounds.   No murmur heard. Pulmonary/Chest: Effort normal and breath sounds normal. No respiratory distress.  Abdominal: Soft. Normal appearance and bowel sounds are normal. There is tenderness in the suprapubic area and left lower quadrant. There is no rigidity, no guarding, no tenderness at McBurney's point and negative Murphy's sign.  Musculoskeletal: Normal range of motion. She exhibits no edema or tenderness.  Lymphadenopathy:  Ankles non-edematous, nonerythematous, not tender. No pitting edema appreciated. DP pulses 2+ bilaterally.  Neurological: She is alert and oriented to person, place, and time. No cranial nerve deficit. Coordination normal.  Skin: Skin is warm and dry. No rash noted. She is not diaphoretic.  Psychiatric: She has a normal mood and affect.  Nursing note and vitals reviewed.   ED Course  Procedures (including critical care time) Labs Review Labs Reviewed  COMPREHENSIVE METABOLIC PANEL - Abnormal; Notable for the following:    Glucose, Bld 131 (*)    GFR calc non Af Amer 79 (*)    All other components within normal limits  CBC WITH DIFFERENTIAL/PLATELET  URINALYSIS, ROUTINE W REFLEX MICROSCOPIC  LIPASE, BLOOD  PREGNANCY, URINE    Imaging Review No results found.   EKG  Interpretation None      MDM   Final diagnoses:  Recurrent abdominal pain  Hydrosalpinx    Patient here tonight complaining of lower abdominal pain consistent with ongoing pain associated with hydrosalpinx. Patient has been seeing her PCP, recently referred to an OB/GYN for this pain. Patient has follow-up appointment with her OB/GYN next week, tonight she is stating she is not experiencing any new symptoms, however she was having intolerable pain, which was unable to be controlled with her pain medicine at home. Based on chronicity and consistency of this pain, do not believe further imaging is warranted at this time. Patient's labs unremarkable for acute pathology. Throughout stay patient is afebrile, non-tachycardic, nontachypneic, non-hypoxic, well-appearing and in no acute distress. No concern for a surgical abdomen, no concern for sepsis or SIRS.  Patient's symptoms managed effectively in the ER, patient requesting to go home after symptomatic therapy. Patient tolerating by mouth well.. Patient to be discharged and strongly encouraged to follow-up with her OB/GYN next week. I discussed return precautions with patient, patient verbalizes understanding and agreement of this plan. Patient states she has plenty of pain medicine  and anti-medics at home, and states she does not need prescription for same. I encouraged patient to call or return to the ER with any worsening symptoms or should she have any questions or concerns.  BP 116/54 mmHg  Pulse 72  Temp(Src) 97.6 F (36.4 C) (Oral)  Resp 18  SpO2 98%  Signed,  Dahlia Bailiff, PA-C 2:00 AM  Patient discussed with Dr. Julianne Rice, MD  Carrie Mew, PA-C 05/15/14 0201  Julianne Rice, MD 05/15/14 [phone]

## 2014-05-15 LAB — PREGNANCY, URINE: Preg Test, Ur: NEGATIVE

## 2014-05-15 MED ORDER — HYDROMORPHONE HCL 1 MG/ML IJ SOLN
1.0000 mg | Freq: Once | INTRAMUSCULAR | Status: AC
  Administered 2014-05-15: 1 mg via INTRAVENOUS
  Filled 2014-05-15: qty 1

## 2014-05-15 MED ORDER — ONDANSETRON HCL 4 MG/2ML IJ SOLN
4.0000 mg | Freq: Once | INTRAMUSCULAR | Status: AC
  Administered 2014-05-15: 4 mg via INTRAVENOUS
  Filled 2014-05-15: qty 2

## 2014-05-15 MED ORDER — SODIUM CHLORIDE 0.9 % IV BOLUS (SEPSIS)
1000.0000 mL | Freq: Once | INTRAVENOUS | Status: AC
  Administered 2014-05-15: 1000 mL via INTRAVENOUS

## 2014-05-15 NOTE — ED Notes (Signed)
Pt given crackers and ginger ale and is tolerating w/o difficulty.

## 2014-05-15 NOTE — Discharge Instructions (Signed)

## 2014-05-23 ENCOUNTER — Ambulatory Visit (INDEPENDENT_AMBULATORY_CARE_PROVIDER_SITE_OTHER): Payer: PRIVATE HEALTH INSURANCE | Admitting: Obstetrics & Gynecology

## 2014-05-23 ENCOUNTER — Encounter: Payer: Self-pay | Admitting: Obstetrics & Gynecology

## 2014-05-23 VITALS — BP 120/77 | HR 71 | Ht 62.0 in | Wt 164.0 lb

## 2014-05-23 DIAGNOSIS — N7011 Chronic salpingitis: Secondary | ICD-10-CM

## 2014-05-23 NOTE — Progress Notes (Signed)
Pt reports that she is following up from the fluid collecting in her tube.

## 2014-05-23 NOTE — Patient Instructions (Signed)
Pelvic Pain Female pelvic pain can be caused by many different things and start from a variety of places. Pelvic pain refers to pain that is located in the lower half of the abdomen and between your hips. The pain may occur over a short period of time (acute) or may be reoccurring (chronic). The cause of pelvic pain may be related to disorders affecting the female reproductive organs (gynecologic), but it may also be related to the bladder, kidney stones, an intestinal complication, or muscle or skeletal problems. Getting help right away for pelvic pain is important, especially if there has been severe, sharp, or a sudden onset of unusual pain. It is also important to get help right away because some types of pelvic pain can be life threatening.  CAUSES  Below are only some of the causes of pelvic pain. The causes of pelvic pain can be in one of several categories.   Gynecologic.  Pelvic inflammatory disease.  Sexually transmitted infection.  Ovarian cyst or a twisted ovarian ligament (ovarian torsion).  Uterine lining that grows outside the uterus (endometriosis).  Fibroids, cysts, or tumors.  Ovulation.  Pregnancy.  Pregnancy that occurs outside the uterus (ectopic pregnancy).  Miscarriage.  Labor.  Abruption of the placenta or ruptured uterus.  Infection.  Uterine infection (endometritis).  Bladder infection.  Diverticulitis.  Miscarriage related to a uterine infection (septic abortion).  Bladder.  Inflammation of the bladder (cystitis).  Kidney stone(s).  Gastrointestinal.  Constipation.  Diverticulitis.  Neurologic.  Trauma.  Feeling pelvic pain because of mental or emotional causes (psychosomatic).  Cancers of the bowel or pelvis. EVALUATION  Your caregiver will want to take a careful history of your concerns. This includes recent changes in your health, a careful gynecologic history of your periods (menses), and a sexual history. Obtaining your family  history and medical history is also important. Your caregiver may suggest a pelvic exam. A pelvic exam will help identify the location and severity of the pain. It also helps in the evaluation of which organ system may be involved. In order to identify the cause of the pelvic pain and be properly treated, your caregiver may order tests. These tests may include:   A pregnancy test.  Pelvic ultrasonography.  An X-ray exam of the abdomen.  A urinalysis or evaluation of vaginal discharge.  Blood tests. HOME CARE INSTRUCTIONS   Only take over-the-counter or prescription medicines for pain, discomfort, or fever as directed by your caregiver.   Rest as directed by your caregiver.   Eat a balanced diet.   Drink enough fluids to make your urine clear or pale yellow, or as directed.   Avoid sexual intercourse if it causes pain.   Apply warm or cold compresses to the lower abdomen depending on which one helps the pain.   Avoid stressful situations.   Keep a journal of your pelvic pain. Write down when it started, where the pain is located, and if there are things that seem to be associated with the pain, such as food or your menstrual cycle.  Follow up with your caregiver as directed.  SEEK MEDICAL CARE IF:  Your medicine does not help your pain.  You have abnormal vaginal discharge. SEEK IMMEDIATE MEDICAL CARE IF:   You have heavy bleeding from the vagina.   Your pelvic pain increases.   You feel light-headed or faint.   You have chills.   You have pain with urination or blood in your urine.   You have uncontrolled diarrhea   or vomiting.   You have a fever or persistent symptoms for more than 3 days.  You have a fever and your symptoms suddenly get worse.   You are being physically or sexually abused.  MAKE SURE YOU:  Understand these instructions.  Will watch your condition.  Will get help if you are not doing well or get worse. Document Released:  02/06/2004 Document Revised: 07/26/2013 Document Reviewed: 07/01/2011 [employer] Patient Information 2015 [employer]. This information is not intended to replace advice given to you by your health care provider. Make sure you discuss any questions you have with your health care provider.  

## 2014-05-31 ENCOUNTER — Ambulatory Visit (HOSPITAL_COMMUNITY)
Admission: RE | Admit: 2014-05-31 | Discharge: 2014-05-31 | Disposition: A | Discharge status: Home / Self Care | Payer: PRIVATE HEALTH INSURANCE | Source: Ambulatory Visit | Attending: Obstetrics & Gynecology | Admitting: Obstetrics & Gynecology

## 2014-05-31 DIAGNOSIS — N7011 Chronic salpingitis: Secondary | ICD-10-CM | POA: Insufficient documentation

## 2014-06-24 ENCOUNTER — Ambulatory Visit (INDEPENDENT_AMBULATORY_CARE_PROVIDER_SITE_OTHER): Payer: PRIVATE HEALTH INSURANCE | Admitting: Obstetrics & Gynecology

## 2014-06-24 ENCOUNTER — Encounter: Payer: Self-pay | Admitting: Obstetrics & Gynecology

## 2014-06-24 DIAGNOSIS — K649 Unspecified hemorrhoids: Secondary | ICD-10-CM

## 2014-06-24 DIAGNOSIS — N7011 Chronic salpingitis: Secondary | ICD-10-CM

## 2014-06-24 MED ORDER — HYDROCORTISONE ACETATE 25 MG RE SUPP: 25.0000 mg | Freq: Two times a day (BID) | RECTAL | Status: DC

## 2014-06-24 NOTE — Patient Instructions (Signed)

## 2014-06-24 NOTE — Progress Notes (Signed)
Patient ID: Jill Vargas, female   DOB: [birth year], 49 y.o.   MRN: [id number]  Chief Complaint  Patient presents with  . Follow-up    HPI Jill Vargas is a 49 y.o. female.  [id number] No LMP recorded. Patient has had an ablation. Seen in ED with following note : Patient here tonight complaining of lower abdominal pain consistent with ongoing pain associated with hydrosalpinx. Patient has been seeing her PCP, recently referred to an OB/GYN for this pain. Patient has follow-up appointment with her OB/GYN next week, tonight she is stating she is not experiencing any new symptoms, however she was having intolerable pain, which was unable to be controlled with her pain medicine at home. Based on chronicity and consistency of this pain, do not believe further imaging is warranted at this time. Patient's labs unremarkable for acute pathology. Throughout stay patient is afebrile, non-tachycardic, nontachypneic, non-hypoxic, well-appearing and in no acute distress. No concern for a surgical abdomen, no concern for sepsis or SIRS. Patient's symptoms managed effectively in the ER, patient requesting to go home after symptomatic therapy. Patient tolerating by mouth well.. Patient to be discharged and strongly encouraged to follow-up with her OB/GYN next week. I discussed return precautions with patient, patient verbalizes understanding and agreement of this plan. Patient states she has plenty of pain medicine and anti-medics at home, and states she does not need prescription for same. I encouraged patient to call or return to the ER with any worsening symptoms or should she have any questions or concerns HPI  Past Medical History  Diagnosis Date  . Fibroid uterus   . Hyperlipemia   . Constipation   . Headache   . Menorrhagia   . Hydrosalpinx 10/2013    left  . Chronic pelvic pain in female   . Chronic back pain   . Ovarian cyst 11/2013    left    Past Surgical History  Procedure Laterality Date  .  Salpingectomy  1998  . Appendectomy    . Dilitation & currettage/hystroscopy with novasure ablation  04/06/2012    Procedure: DILATATION & CURETTAGE/HYSTEROSCOPY WITH NOVASURE ABLATION;  Surgeon: Lavonia Drafts, MD;  Location: [facility];  Service: Gynecology;  Laterality: N/A;    Family History  Problem Relation Age of Onset  . Hypertension Mother   . Diabetes Father     Social History History  Substance Use Topics  . Smoking status: Former Research scientist (life sciences)  . Smokeless tobacco: Never Used  . Alcohol Use: No    No Known Allergies  Current Outpatient Prescriptions  Medication Sig Dispense Refill  . Ascorbic Acid (VITAMIN C PO) Take 1 tablet by mouth daily.    Marland Kitchen atorvastatin (LIPITOR) 20 MG tablet Take 20 mg by mouth daily.    . diclofenac (CATAFLAM) 50 MG tablet Take 1 tablet (50 mg total) by mouth 3 (three) times daily with meals as needed (moderate pain). 60 tablet 1  . doxycycline (VIBRA-TABS) 100 MG tablet Take 1 tablet (100 mg total) by mouth 2 (two) times daily. (Patient not taking: Reported on 05/14/2014) 28 tablet 0  . HYDROcodone-acetaminophen (NORCO/VICODIN) 5-325 MG per tablet 1 or 2 tabs PO q6 hours prn pain (Patient not taking: Reported on 05/14/2014) 20 tablet 0  . hydrocortisone (ANUSOL-HC) 25 MG suppository Place 1 suppository (25 mg total) rectally 2 (two) times daily. 12 suppository 1  . OVER THE COUNTER MEDICATION Take 1 tablet by mouth daily. Estrogen Vitamin    . traMADol (ULTRAM) 50 MG tablet Take 1 tablet (50  mg total) by mouth every 6 (six) hours as needed for severe pain. (Patient not taking: Reported on 04/20/2014) 60 tablet 0  . VITAMIN E PO Take 1 tablet by mouth daily.     No current facility-administered medications for this visit.    Review of Systems Review of Systems  Constitutional: Negative.   Gastrointestinal: Negative.   Genitourinary: Positive for pelvic pain (LLQ).    Blood pressure 120/77, pulse 71, height 5\' 2"  (1.575 m), weight 164 lb (74.39  kg).  Physical Exam Physical Exam  Constitutional: She appears well-developed. No distress.  Abdominal: Soft. She exhibits no mass. There is tenderness (mild).  Genitourinary: Vagina normal and uterus normal. No vaginal discharge found.  No mass  Skin: Skin is warm and dry.  Psychiatric: She has a normal mood and affect. Her behavior is normal.    Data Reviewed [place] result left hydrosalpinx  Assessment    Pain left hydrosalpinx     Plan    Repeat US and RTC mo        Kamdin Follett

## 2014-06-24 NOTE — Progress Notes (Signed)
Patient ID: Jill Vargas, female   DOB: [birth year], 49 y.o.   MRN: [id number] Pt states she has external hemorrhoid that physician needs to assess.

## 2014-06-24 NOTE — Progress Notes (Signed)
Patient ID: Jill Vargas, female   DOB: [birth year], 49 y.o.   MRN: [id number]  Chief Complaint  Patient presents with  . Follow-up    HPI Jill Vargas is a 49 y.o. female.  [id number] No LMP recorded. Patient has had an ablation. LLQ pain is mild but wants to have surgery to reslove. Hydrosalpinx on [place] persists  HPI  Past Medical History  Diagnosis Date  . Fibroid uterus   . Hyperlipemia   . Constipation   . Headache   . Menorrhagia   . Hydrosalpinx 10/2013    left  . Chronic pelvic pain in female   . Chronic back pain   . Ovarian cyst 11/2013    left    Past Surgical History  Procedure Laterality Date  . Salpingectomy  1998  . Appendectomy    . Dilitation & currettage/hystroscopy with novasure ablation  04/06/2012    Procedure: DILATATION & CURETTAGE/HYSTEROSCOPY WITH NOVASURE ABLATION;  Surgeon: Lavonia Drafts, MD;  Location: [facility];  Service: Gynecology;  Laterality: N/A;    Family History  Problem Relation Age of Onset  . Hypertension Mother   . Diabetes Father     Social History History  Substance Use Topics  . Smoking status: Former Research scientist (life sciences)  . Smokeless tobacco: Never Used  . Alcohol Use: No    No Known Allergies  Current Outpatient Prescriptions  Medication Sig Dispense Refill  . Ascorbic Acid (VITAMIN C PO) Take 1 tablet by mouth daily.    Marland Kitchen atorvastatin (LIPITOR) 20 MG tablet Take 20 mg by mouth daily.    . diclofenac (CATAFLAM) 50 MG tablet Take 1 tablet (50 mg total) by mouth 3 (three) times daily with meals as needed (moderate pain). 60 tablet 1  . VITAMIN E PO Take 1 tablet by mouth daily.    Marland Kitchen doxycycline (VIBRA-TABS) 100 MG tablet Take 1 tablet (100 mg total) by mouth 2 (two) times daily. (Patient not taking: Reported on 05/14/2014) 28 tablet 0  . HYDROcodone-acetaminophen (NORCO/VICODIN) 5-325 MG per tablet 1 or 2 tabs PO q6 hours prn pain (Patient not taking: Reported on 05/14/2014) 20 tablet 0  . hydrocortisone (ANUSOL-HC) 25 MG  suppository Place 1 suppository (25 mg total) rectally 2 (two) times daily. 12 suppository 1  . OVER THE COUNTER MEDICATION Take 1 tablet by mouth daily. Estrogen Vitamin    . traMADol (ULTRAM) 50 MG tablet Take 1 tablet (50 mg total) by mouth every 6 (six) hours as needed for severe pain. (Patient not taking: Reported on 04/20/2014) 60 tablet 0   No current facility-administered medications for this visit.    Review of Systems Review of Systems  Constitutional: Negative.   Gastrointestinal: Positive for abdominal pain and constipation.       Hemorrhoid  Genitourinary: Positive for pelvic pain. Negative for vaginal bleeding and vaginal pain.    There were no vitals taken for this visit.  Physical Exam Physical Exam  Constitutional: She is oriented to person, place, and time. She appears well-developed. No distress.  Pulmonary/Chest: Effort normal. No respiratory distress.  Abdominal: Soft. She exhibits no mass. There is no tenderness.  Neurological: She is alert and oriented to person, place, and time.  Psychiatric: She has a normal mood and affect. Her behavior is normal.    Data Reviewed [place] result  Assessment    Left hydrosalpinx and pain     Plan    Laparoscopy and left salpingectomy,  The procedure and the risk of anesthesia, pain, bleeding, infection, bowel  and bladder injury were discussed and her questions were answered. The procedure will be scheduled as an outpatient.         Jill Vargas 06/24/2014, 9:18 AM

## 2014-07-05 ENCOUNTER — Emergency Department (HOSPITAL_COMMUNITY): Payer: PRIVATE HEALTH INSURANCE

## 2014-07-05 ENCOUNTER — Encounter (HOSPITAL_COMMUNITY): Payer: Self-pay | Admitting: Emergency Medicine

## 2014-07-05 ENCOUNTER — Emergency Department (HOSPITAL_COMMUNITY)
Admission: EM | Admit: 2014-07-05 | Discharge: 2014-07-05 | Disposition: A | Discharge status: Home / Self Care | Payer: PRIVATE HEALTH INSURANCE | Attending: Emergency Medicine | Admitting: Emergency Medicine

## 2014-07-05 DIAGNOSIS — E785 Hyperlipidemia, unspecified: Secondary | ICD-10-CM | POA: Diagnosis not present

## 2014-07-05 DIAGNOSIS — Z79899 Other long term (current) drug therapy: Secondary | ICD-10-CM | POA: Insufficient documentation

## 2014-07-05 DIAGNOSIS — G8929 Other chronic pain: Secondary | ICD-10-CM | POA: Diagnosis not present

## 2014-07-05 DIAGNOSIS — R112 Nausea with vomiting, unspecified: Secondary | ICD-10-CM | POA: Diagnosis not present

## 2014-07-05 DIAGNOSIS — Z8719 Personal history of other diseases of the digestive system: Secondary | ICD-10-CM | POA: Insufficient documentation

## 2014-07-05 DIAGNOSIS — Z7952 Long term (current) use of systemic steroids: Secondary | ICD-10-CM | POA: Insufficient documentation

## 2014-07-05 DIAGNOSIS — Z791 Long term (current) use of non-steroidal anti-inflammatories (NSAID): Secondary | ICD-10-CM | POA: Insufficient documentation

## 2014-07-05 DIAGNOSIS — N898 Other specified noninflammatory disorders of vagina: Secondary | ICD-10-CM | POA: Diagnosis not present

## 2014-07-05 DIAGNOSIS — Z792 Long term (current) use of antibiotics: Secondary | ICD-10-CM | POA: Insufficient documentation

## 2014-07-05 DIAGNOSIS — Z87891 Personal history of nicotine dependence: Secondary | ICD-10-CM | POA: Diagnosis not present

## 2014-07-05 DIAGNOSIS — N7011 Chronic salpingitis: Secondary | ICD-10-CM

## 2014-07-05 DIAGNOSIS — R1032 Left lower quadrant pain: Secondary | ICD-10-CM | POA: Diagnosis present

## 2014-07-05 LAB — URINALYSIS, ROUTINE W REFLEX MICROSCOPIC
Bilirubin Urine: NEGATIVE
GLUCOSE, UA: NEGATIVE mg/dL
HGB URINE DIPSTICK: NEGATIVE
Ketones, ur: NEGATIVE mg/dL
LEUKOCYTES UA: NEGATIVE
NITRITE: NEGATIVE
Protein, ur: NEGATIVE mg/dL
SPECIFIC GRAVITY, URINE: 1.031 — AB (ref 1.005–1.030)
UROBILINOGEN UA: 0.2 mg/dL (ref 0.0–1.0)
pH: 5 (ref 5.0–8.0)

## 2014-07-05 LAB — COMPREHENSIVE METABOLIC PANEL
ALT: 28 U/L (ref 0–35)
ANION GAP: 8 (ref 5–15)
AST: 25 U/L (ref 0–37)
Albumin: 4.4 g/dL (ref 3.5–5.2)
Alkaline Phosphatase: 76 U/L (ref 39–117)
BILIRUBIN TOTAL: 0.7 mg/dL (ref 0.3–1.2)
BUN: 13 mg/dL (ref 6–23)
CHLORIDE: 108 mmol/L (ref 96–112)
CO2: 24 mmol/L (ref 19–32)
CREATININE: 0.72 mg/dL (ref 0.50–1.10)
Calcium: 9.2 mg/dL (ref 8.4–10.5)
GFR calc Af Amer: >90 mL/min (ref >90)
GFR calc non Af Amer: >90 mL/min (ref >90)
Glucose, Bld: 124 mg/dL — ABNORMAL HIGH (ref 70–99)
Potassium: 3.9 mmol/L (ref 3.5–5.1)
Sodium: 140 mmol/L (ref 135–145)
TOTAL PROTEIN: 7.4 g/dL (ref 6.0–8.3)

## 2014-07-05 LAB — CBC WITH DIFFERENTIAL/PLATELET
BASOS ABS: 0 10*3/uL (ref 0.0–0.1)
Basophils Relative: 0 % (ref 0–1)
EOS PCT: 2 % (ref 0–5)
Eosinophils Absolute: 0.2 10*3/uL (ref 0.0–0.7)
HEMATOCRIT: 42.5 % (ref 36.0–46.0)
HEMOGLOBIN: 14.2 g/dL (ref 12.0–15.0)
LYMPHS ABS: 2.9 10*3/uL (ref 0.7–4.0)
Lymphocytes Relative: 37 % (ref 12–46)
MCH: 29.2 pg (ref 26.0–34.0)
MCHC: 33.4 g/dL (ref 30.0–36.0)
MCV: 87.3 fL (ref 78.0–100.0)
Monocytes Absolute: 0.7 10*3/uL (ref 0.1–1.0)
Monocytes Relative: 9 % (ref 3–12)
Neutro Abs: 4 10*3/uL (ref 1.7–7.7)
Neutrophils Relative %: 52 % (ref 43–77)
Platelets: 228 10*3/uL (ref 150–400)
RBC: 4.87 MIL/uL (ref 3.87–5.11)
RDW: 12.5 % (ref 11.5–15.5)
WBC: 7.8 10*3/uL (ref 4.0–10.5)

## 2014-07-05 LAB — WET PREP, GENITAL
Clue Cells Wet Prep HPF POC: NONE SEEN
TRICH WET PREP: NONE SEEN
Yeast Wet Prep HPF POC: NONE SEEN

## 2014-07-05 LAB — LIPASE, BLOOD: LIPASE: 31 U/L (ref 11–59)

## 2014-07-05 LAB — GC/CHLAMYDIA PROBE AMP (~~LOC~~) NOT AT ARMC
CHLAMYDIA, DNA PROBE: NEGATIVE
Neisseria Gonorrhea: NEGATIVE

## 2014-07-05 LAB — I-STAT TROPONIN, ED: TROPONIN I, POC: 0 ng/mL (ref 0.00–0.08)

## 2014-07-05 MED ORDER — ONDANSETRON 8 MG PO TBDP
8.0000 mg | ORAL_TABLET | Freq: Once | ORAL | Status: AC
Start: 2014-07-05 — End: 2014-07-05
  Administered 2014-07-05: 8 mg via ORAL
  Filled 2014-07-05: qty 1

## 2014-07-05 MED ORDER — HYDROMORPHONE HCL 1 MG/ML IJ SOLN
1.0000 mg | Freq: Once | INTRAMUSCULAR | Status: AC
  Administered 2014-07-05: 1 mg via INTRAVENOUS
  Filled 2014-07-05: qty 1

## 2014-07-05 MED ORDER — SODIUM CHLORIDE 0.9 % IV BOLUS (SEPSIS)
1000.0000 mL | Freq: Once | INTRAVENOUS | Status: AC
  Administered 2014-07-05: 1000 mL via INTRAVENOUS

## 2014-07-05 MED ORDER — HYDROCODONE-ACETAMINOPHEN 5-325 MG PO TABS: 1.0000 | ORAL_TABLET | Freq: Four times a day (QID) | ORAL | Status: DC | PRN

## 2014-07-05 NOTE — ED Notes (Signed)
Report given to Morgan RN °

## 2014-07-05 NOTE — ED Provider Notes (Signed)
CSN: [id number]     Arrival date & time 07/05/14  0139 History   First MD Initiated Contact with Patient 07/05/14 0402     Chief Complaint  Patient presents with  . Nausea     (Consider location/radiation/quality/duration/timing/severity/associated sxs/prior Treatment) HPI Comments: Hx of hydrosalpinx, has scheduled for operative repair and removal in 6 weeks. Pain has worsened and is having foul smelling vaginal discharge. Denies any sexual activity or concern for STDs. No fevers.   Patient is a 49 y.o. female presenting with abdominal pain. The history is provided by the patient.  Abdominal Pain Pain location:  LLQ Pain quality: aching   Pain radiates to:  Does not radiate Pain severity:  Moderate Onset quality:  Gradual Timing:  Constant Progression:  Unchanged Chronicity:  Chronic Context: not retching and not sick contacts   Relieved by:  Nothing Worsened by:  Nothing tried Associated symptoms: vomiting   Associated symptoms: no cough, no diarrhea, no fever and no shortness of breath     Past Medical History  Diagnosis Date  . Fibroid uterus   . Hyperlipemia   . Constipation   . Headache   . Menorrhagia   . Hydrosalpinx 10/2013    left  . Chronic pelvic pain in female   . Chronic back pain   . Ovarian cyst 11/2013    left   Past Surgical History  Procedure Laterality Date  . Salpingectomy  1998  . Appendectomy    . Dilitation & currettage/hystroscopy with novasure ablation  04/06/2012    Procedure: DILATATION & CURETTAGE/HYSTEROSCOPY WITH NOVASURE ABLATION;  Surgeon: Lavonia Drafts, MD;  Location: [facility];  Service: Gynecology;  Laterality: N/A;   Family History  Problem Relation Age of Onset  . Hypertension Mother   . Diabetes Father    History  Substance Use Topics  . Smoking status: Former Research scientist (life sciences)  . Smokeless tobacco: Never Used  . Alcohol Use: No   OB History    Gravida Para Term Preterm AB TAB SAB Ectopic Multiple Living   3 3 1 2  0 0 0 0  0 3     Review of Systems  Constitutional: Negative for fever.  Respiratory: Negative for cough and shortness of breath.   Gastrointestinal: Positive for vomiting and abdominal pain. Negative for diarrhea.  All other systems reviewed and are negative.     Allergies  Review of patient's allergies indicates no known allergies.  Home Medications   Prior to Admission medications   Medication Sig Start Date End Date Taking? Authorizing Provider  atorvastatin (LIPITOR) 20 MG tablet Take 20 mg by mouth daily.   Yes Historical Provider, MD  diclofenac (CATAFLAM) 50 MG tablet Take 1 tablet (50 mg total) by mouth 3 (three) times daily with meals as needed (moderate pain). Patient not taking: Reported on 07/05/2014 01/03/14   Lavonia Drafts, MD  doxycycline (VIBRA-TABS) 100 MG tablet Take 1 tablet (100 mg total) by mouth 2 (two) times daily. Patient not taking: Reported on 05/14/2014 04/20/14   Francine Graven, DO  HYDROcodone-acetaminophen [facility]) 5-325 MG per tablet Take 1 tablet by mouth every 6 (six) hours as needed. 07/05/14   Evelina Bucy, MD  HYDROcodone-acetaminophen (NORCO/VICODIN) 5-325 MG per tablet 1 or 2 tabs PO q6 hours prn pain Patient not taking: Reported on 05/14/2014 04/20/14   Francine Graven, DO  hydrocortisone (ANUSOL-HC) 25 MG suppository Place 1 suppository (25 mg total) rectally 2 (two) times daily. Patient not taking: Reported on 07/05/2014 06/24/14   Alvina Filbert  Roselie Awkward, MD  traMADol (ULTRAM) 50 MG tablet Take 1 tablet (50 mg total) by mouth every 6 (six) hours as needed for severe pain. Patient not taking: Reported on 04/20/2014 01/03/14   Lavonia Drafts, MD   BP 119/59 mmHg  Pulse 63  Temp(Src) 98.1 F (36.7 C) (Oral)  Resp 18  Ht 5\' 1"  (1.549 m)  Wt 157 lb (71.215 kg)  BMI 29.68 kg/m2  SpO2 100% Physical Exam  Constitutional: She is oriented to person, place, and time. She appears well-developed and well-nourished. No distress.  HENT:  Head:  Normocephalic and atraumatic.  Mouth/Throat: Oropharynx is clear and moist.  Eyes: EOM are normal. Pupils are equal, round, and reactive to light.  Neck: Normal range of motion. Neck supple.  Cardiovascular: Normal rate and regular rhythm.  Exam reveals no friction rub.   No murmur heard. Pulmonary/Chest: Effort normal and breath sounds normal. No respiratory distress. She has no wheezes. She has no rales.  Abdominal: Soft. She exhibits no distension. There is tenderness (mild LLQ). There is no rebound.  Genitourinary: Cervix exhibits no motion tenderness, no discharge and no friability. Right adnexum displays no mass, no tenderness and no fullness. Left adnexum displays tenderness. Left adnexum displays no mass and no fullness.  Musculoskeletal: Normal range of motion. She exhibits no edema.  Neurological: She is alert and oriented to person, place, and time.  Skin: She is not diaphoretic.  Nursing note and vitals reviewed.   ED Course  Procedures (including critical care time) Labs Review Labs Reviewed  WET PREP, GENITAL - Abnormal; Notable for the following:    WBC, Wet Prep HPF POC FEW (*)    All other components within normal limits  COMPREHENSIVE METABOLIC PANEL - Abnormal; Notable for the following:    Glucose, Bld 124 (*)    All other components within normal limits  URINALYSIS, ROUTINE W REFLEX MICROSCOPIC - Abnormal; Notable for the following:    Specific Gravity, Urine 1.031 (*)    All other components within normal limits  CBC WITH DIFFERENTIAL/PLATELET  LIPASE, BLOOD  I-STAT TROPOININ, ED  GC/CHLAMYDIA PROBE AMP ([place])    Imaging Review US Transvaginal Non-ob  07/05/2014   CLINICAL DATA:  Left lower quadrant and pelvic pain. Vaginal discharge  EXAM: TRANSABDOMINAL AND TRANSVAGINAL ULTRASOUND OF PELVIS  TECHNIQUE: Study was performed transabdominally to optimize pelvic field of view evaluation and transvaginally to optimize internal visceral architecture  evaluation.  COMPARISON:  May 31, 2014  FINDINGS: Uterus  Measurements: 7.7 x 3.5 x 4.4 cm. There is no well-defined intrauterine mass. However, the echotexture of the uterus is inhomogeneous which raises question of underlying leiomyomatous change throughout the myometrium.  Endometrium  Thickness: 3 mm. A small amount of fluid is seen within the endometrium. No mass or polyp seen.  Right ovary  Measurements: 2.2 x 1.3 x 1.3 cm. Normal appearance/no adnexal mass.  Left ovary  Measurements: 2.2 x 1.7 x 1.2 cm. There is again noted an approximately 5.3 x 1.2 x 1.3 cm irregular fluid collection in the left adnexal region, felt to represent persistence of hydrosalpinx. No new lesion identified. Incidental note is made of a dominant follicle in the left ovary measuring 1.3 x 0.6 x 0.7 cm.  Other findings  No free fluid.  IMPRESSION: Persist and essentially stable hydrosalpinx on the left. Dominant follicle left ovary. Suspect leiomyomatous change in the uterus, although there is no well-defined dominant uterine mass. Small amount of fluid in the endometrium likely represents hemorrhage. Endometrium  is not thickened.   Electronically Signed   By: Lowella Grip III M.D.   On: 07/05/2014 08:13   US Pelvis Complete  07/05/2014   CLINICAL DATA:  Left lower quadrant and pelvic pain. Vaginal discharge  EXAM: TRANSABDOMINAL AND TRANSVAGINAL ULTRASOUND OF PELVIS  TECHNIQUE: Study was performed transabdominally to optimize pelvic field of view evaluation and transvaginally to optimize internal visceral architecture evaluation.  COMPARISON:  May 31, 2014  FINDINGS: Uterus  Measurements: 7.7 x 3.5 x 4.4 cm. There is no well-defined intrauterine mass. However, the echotexture of the uterus is inhomogeneous which raises question of underlying leiomyomatous change throughout the myometrium.  Endometrium  Thickness: 3 mm. A small amount of fluid is seen within the endometrium. No mass or polyp seen.  Right ovary  Measurements:  2.2 x 1.3 x 1.3 cm. Normal appearance/no adnexal mass.  Left ovary  Measurements: 2.2 x 1.7 x 1.2 cm. There is again noted an approximately 5.3 x 1.2 x 1.3 cm irregular fluid collection in the left adnexal region, felt to represent persistence of hydrosalpinx. No new lesion identified. Incidental note is made of a dominant follicle in the left ovary measuring 1.3 x 0.6 x 0.7 cm.  Other findings  No free fluid.  IMPRESSION: Persist and essentially stable hydrosalpinx on the left. Dominant follicle left ovary. Suspect leiomyomatous change in the uterus, although there is no well-defined dominant uterine mass. Small amount of fluid in the endometrium likely represents hemorrhage. Endometrium is not thickened.   Electronically Signed   By: Lowella Grip III M.D.   On: 07/05/2014 08:13     EKG Interpretation None      MDM   Final diagnoses:  Vaginal discharge  Hydrosalpinx    49 year old female here with vaginal discharge and lower abdominal pain. History of left hydrosalpinx which she is having removed surgically in 6 weeks by Dr. Roselie Awkward with OB. She has a foul smell coming from her body. Associated nausea and vomiting. No fevers. No dysuria. On exam she has suprapubic and left lower pelvic tenderness. We'll perform pelvic and plan on repeat ultrasound. Labs normal. Pain meds and nausea meds given. [place] with stable hydrosalpinx. Stable for discharge. Given pain meds. Instructed to f/u with OB/GYN.   Evelina Bucy, MD 07/05/14 [phone]

## 2014-07-05 NOTE — ED Notes (Signed)
Patient c/o left sided pain, nausea, and "smelling pus coming from her body". Patient is scheduled for surgery on her fallopian tubes in May. Patient states she called the surgeons office and he advised her to seek evaluation in the ER. Vomiting x3 episodes in past 24 hours.

## 2014-07-26 ENCOUNTER — Telehealth: Payer: Self-pay | Admitting: General Practice

## 2014-07-26 NOTE — Telephone Encounter (Signed)
FMLA paperwork completed and faxed. Called patient and informed her of FMLA papers completed and faxed. Patient verbalized understanding and had no questions

## 2014-07-31 ENCOUNTER — Other Ambulatory Visit: Payer: Self-pay | Admitting: Obstetrics & Gynecology

## 2014-08-08 NOTE — Patient Instructions (Addendum)
  Your procedure is scheduled on: Thursday, May 26  Enter through the Main Entrance of [facility] at: 1:15 PM Pick up the phone at the desk and dial [phone] and inform us of your arrival.  Please call this number if you have any problems the morning of surgery: [phone]  Remember: Do not eat food after midnight: Wednesday Do not drink clear liquids after: 10 AM Thursday, day of surgery Take these medicines the morning of surgery with a SIP OF WATER:  None  Do not wear jewelry, make-up, or FINGER nail polish No metal in your hair or on your body. Do not wear lotions, powders, perfumes.  You may wear deodorant.  Do not bring valuables to the hospital. Contacts, dentures or bridgework may not be worn into surgery.   Patients discharged on the day of surgery will not be allowed to drive home.  Home with friend Ms. May

## 2014-08-09 ENCOUNTER — Encounter (HOSPITAL_COMMUNITY)
Admission: RE | Admit: 2014-08-09 | Discharge: 2014-08-09 | Disposition: A | Discharge status: Home / Self Care | Payer: 59 | Source: Ambulatory Visit | Attending: Obstetrics & Gynecology | Admitting: Obstetrics & Gynecology

## 2014-08-09 ENCOUNTER — Encounter (HOSPITAL_COMMUNITY): Payer: Self-pay

## 2014-08-09 DIAGNOSIS — Z01812 Encounter for preprocedural laboratory examination: Secondary | ICD-10-CM | POA: Diagnosis present

## 2014-08-09 LAB — CBC
HCT: 41.6 % (ref 36.0–46.0)
Hemoglobin: 14.2 g/dL (ref 12.0–15.0)
MCH: 29.5 pg (ref 26.0–34.0)
MCHC: 34.1 g/dL (ref 30.0–36.0)
MCV: 86.3 fL (ref 78.0–100.0)
PLATELETS: 216 10*3/uL (ref 150–400)
RBC: 4.82 MIL/uL (ref 3.87–5.11)
RDW: 12.8 % (ref 11.5–15.5)
WBC: 6 10*3/uL (ref 4.0–10.5)

## 2014-08-16 NOTE — Anesthesia Preprocedure Evaluation (Addendum)
Anesthesia Evaluation  Patient identified by MRN, date of birth, ID band Patient awake    Reviewed: Allergy & Precautions, NPO status , Patient's Chart, lab work & pertinent test results, reviewed documented beta blocker date and time   Airway Mallampati: II   Neck ROM: Full    Dental  (+) Missing, Dental Advisory Given   Pulmonary former smoker (quit 2002),  breath sounds clear to auscultation        Cardiovascular negative cardio ROS  Rhythm:Regular     Neuro/Psych    GI/Hepatic negative GI ROS, Neg liver ROS,   Endo/Other  negative endocrine ROS  Renal/GU negative Renal ROS     Musculoskeletal   Abdominal (+)  Abdomen: soft.    Peds  Hematology negative hematology ROS (+)   Anesthesia Other Findings Missing some molars, front good  Reproductive/Obstetrics                            Anesthesia Physical Anesthesia Plan  ASA: II  Anesthesia Plan: General   Post-op Pain Management:    Induction: Intravenous  Airway Management Planned: Oral ETT  Additional Equipment:   Intra-op Plan:   Post-operative Plan: Extubation in OR  Informed Consent: I have reviewed the patients History and Physical, chart, labs and discussed the procedure including the risks, benefits and alternatives for the proposed anesthesia with the patient or authorized representative who has indicated his/her understanding and acceptance.     Plan Discussed with:   Anesthesia Plan Comments:         Anesthesia Quick Evaluation

## 2014-08-18 ENCOUNTER — Encounter (HOSPITAL_COMMUNITY): Payer: Self-pay | Admitting: *Deleted

## 2014-08-18 ENCOUNTER — Encounter (HOSPITAL_COMMUNITY)
Admission: RE | Disposition: A | Discharge status: Home / Self Care | Payer: Self-pay | Source: Ambulatory Visit | Attending: Obstetrics & Gynecology

## 2014-08-18 ENCOUNTER — Ambulatory Visit (HOSPITAL_COMMUNITY): Payer: 59 | Admitting: Anesthesiology

## 2014-08-18 ENCOUNTER — Ambulatory Visit (HOSPITAL_COMMUNITY)
Admission: RE | Admit: 2014-08-18 | Discharge: 2014-08-18 | Disposition: A | Discharge status: Home / Self Care | Payer: 59 | Source: Ambulatory Visit | Attending: Obstetrics & Gynecology | Admitting: Obstetrics & Gynecology

## 2014-08-18 DIAGNOSIS — E785 Hyperlipidemia, unspecified: Secondary | ICD-10-CM | POA: Diagnosis not present

## 2014-08-18 DIAGNOSIS — Z87891 Personal history of nicotine dependence: Secondary | ICD-10-CM | POA: Insufficient documentation

## 2014-08-18 DIAGNOSIS — Z9079 Acquired absence of other genital organ(s): Secondary | ICD-10-CM | POA: Insufficient documentation

## 2014-08-18 DIAGNOSIS — Z791 Long term (current) use of non-steroidal anti-inflammatories (NSAID): Secondary | ICD-10-CM | POA: Insufficient documentation

## 2014-08-18 DIAGNOSIS — R102 Pelvic and perineal pain: Secondary | ICD-10-CM

## 2014-08-18 DIAGNOSIS — Z7952 Long term (current) use of systemic steroids: Secondary | ICD-10-CM | POA: Insufficient documentation

## 2014-08-18 DIAGNOSIS — G8929 Other chronic pain: Secondary | ICD-10-CM | POA: Insufficient documentation

## 2014-08-18 DIAGNOSIS — N7011 Chronic salpingitis: Secondary | ICD-10-CM | POA: Insufficient documentation

## 2014-08-18 DIAGNOSIS — M549 Dorsalgia, unspecified: Secondary | ICD-10-CM | POA: Diagnosis not present

## 2014-08-18 DIAGNOSIS — Z79899 Other long term (current) drug therapy: Secondary | ICD-10-CM | POA: Insufficient documentation

## 2014-08-18 HISTORY — PX: LAPAROSCOPIC BILATERAL SALPINGECTOMY: SHX5889

## 2014-08-18 LAB — PREGNANCY, URINE: Preg Test, Ur: NEGATIVE

## 2014-08-18 SURGERY — SALPINGECTOMY, BILATERAL, LAPAROSCOPIC
Anesthesia: General | Site: Abdomen | Laterality: Left

## 2014-08-18 MED ORDER — BUPIVACAINE HCL (PF) 0.25 % IJ SOLN
INTRAMUSCULAR | Status: DC | PRN
  Administered 2014-08-18: 18 mL

## 2014-08-18 MED ORDER — SCOPOLAMINE 1 MG/3DAYS TD PT72
1.0000 | MEDICATED_PATCH | TRANSDERMAL | Status: DC
  Administered 2014-08-18: 1.5 mg via TRANSDERMAL

## 2014-08-18 MED ORDER — FENTANYL CITRATE (PF) 100 MCG/2ML IJ SOLN
INTRAMUSCULAR | Status: DC | PRN
  Administered 2014-08-18: 100 ug via INTRAVENOUS
  Administered 2014-08-18 (×3): 50 ug via INTRAVENOUS

## 2014-08-18 MED ORDER — GLYCOPYRROLATE 0.2 MG/ML IJ SOLN
INTRAMUSCULAR | Status: DC | PRN
  Administered 2014-08-18: 0.6 mg via INTRAVENOUS

## 2014-08-18 MED ORDER — HYDROCODONE-ACETAMINOPHEN 7.5-325 MG PO TABS: 1.0000 | ORAL_TABLET | Freq: Once | ORAL | Status: DC | PRN

## 2014-08-18 MED ORDER — SCOPOLAMINE 1 MG/3DAYS TD PT72
MEDICATED_PATCH | TRANSDERMAL | Status: AC
  Filled 2014-08-18: qty 1

## 2014-08-18 MED ORDER — FENTANYL CITRATE (PF) 100 MCG/2ML IJ SOLN
25.0000 ug | INTRAMUSCULAR | Status: DC | PRN
  Administered 2014-08-18 (×2): 50 ug via INTRAVENOUS

## 2014-08-18 MED ORDER — FENTANYL CITRATE (PF) 100 MCG/2ML IJ SOLN
INTRAMUSCULAR | Status: AC
  Administered 2014-08-18: 50 ug via INTRAVENOUS
  Filled 2014-08-18: qty 2

## 2014-08-18 MED ORDER — LACTATED RINGERS IV SOLN
INTRAVENOUS | Status: DC
  Administered 2014-08-18 (×3): via INTRAVENOUS

## 2014-08-18 MED ORDER — MIDAZOLAM HCL 2 MG/2ML IJ SOLN
INTRAMUSCULAR | Status: AC
  Filled 2014-08-18: qty 2

## 2014-08-18 MED ORDER — PROPOFOL 10 MG/ML IV BOLUS
INTRAVENOUS | Status: DC | PRN
  Administered 2014-08-18: 150 mg via INTRAVENOUS

## 2014-08-18 MED ORDER — FENTANYL CITRATE (PF) 100 MCG/2ML IJ SOLN: 25.0000 ug | INTRAMUSCULAR | Status: DC | PRN

## 2014-08-18 MED ORDER — ROCURONIUM BROMIDE 100 MG/10ML IV SOLN
INTRAVENOUS | Status: DC | PRN
  Administered 2014-08-18: 10 mg via INTRAVENOUS
  Administered 2014-08-18: 20 mg via INTRAVENOUS

## 2014-08-18 MED ORDER — KETOROLAC TROMETHAMINE 30 MG/ML IJ SOLN
INTRAMUSCULAR | Status: DC | PRN
  Administered 2014-08-18: 30 mg via INTRAVENOUS

## 2014-08-18 MED ORDER — KETOROLAC TROMETHAMINE 30 MG/ML IJ SOLN
INTRAMUSCULAR | Status: AC
  Filled 2014-08-18: qty 1

## 2014-08-18 MED ORDER — FENTANYL CITRATE (PF) 250 MCG/5ML IJ SOLN
INTRAMUSCULAR | Status: AC
  Filled 2014-08-18: qty 5

## 2014-08-18 MED ORDER — OXYCODONE-ACETAMINOPHEN 5-325 MG PO TABS: 1.0000 | ORAL_TABLET | Freq: Four times a day (QID) | ORAL | Status: DC | PRN

## 2014-08-18 MED ORDER — NEOSTIGMINE METHYLSULFATE 10 MG/10ML IV SOLN
INTRAVENOUS | Status: AC
  Filled 2014-08-18: qty 1

## 2014-08-18 MED ORDER — LIDOCAINE HCL (CARDIAC) 20 MG/ML IV SOLN
INTRAVENOUS | Status: AC
  Filled 2014-08-18: qty 5

## 2014-08-18 MED ORDER — ROCURONIUM BROMIDE 100 MG/10ML IV SOLN
INTRAVENOUS | Status: AC
  Filled 2014-08-18: qty 1

## 2014-08-18 MED ORDER — DEXAMETHASONE SODIUM PHOSPHATE 4 MG/ML IJ SOLN
INTRAMUSCULAR | Status: AC
  Filled 2014-08-18: qty 1

## 2014-08-18 MED ORDER — NEOSTIGMINE METHYLSULFATE 10 MG/10ML IV SOLN
INTRAVENOUS | Status: DC | PRN
  Administered 2014-08-18: 3 mg via INTRAVENOUS

## 2014-08-18 MED ORDER — MIDAZOLAM HCL 2 MG/2ML IJ SOLN
INTRAMUSCULAR | Status: DC | PRN
  Administered 2014-08-18: 1 mg via INTRAVENOUS

## 2014-08-18 MED ORDER — LACTATED RINGERS IV SOLN
INTRAVENOUS | Status: DC
  Administered 2014-08-18: 13:00:00 via INTRAVENOUS

## 2014-08-18 MED ORDER — MEPERIDINE HCL 25 MG/ML IJ SOLN: 6.2500 mg | INTRAMUSCULAR | Status: DC | PRN

## 2014-08-18 MED ORDER — DEXAMETHASONE SODIUM PHOSPHATE 10 MG/ML IJ SOLN
INTRAMUSCULAR | Status: DC | PRN
  Administered 2014-08-18: 4 mg via INTRAVENOUS

## 2014-08-18 MED ORDER — GLYCOPYRROLATE 0.2 MG/ML IJ SOLN
INTRAMUSCULAR | Status: AC
  Filled 2014-08-18: qty 3

## 2014-08-18 MED ORDER — LIDOCAINE HCL (CARDIAC) 20 MG/ML IV SOLN
INTRAVENOUS | Status: DC | PRN
  Administered 2014-08-18: 50 mg via INTRAVENOUS

## 2014-08-18 MED ORDER — ONDANSETRON HCL 4 MG/2ML IJ SOLN
INTRAMUSCULAR | Status: DC | PRN
  Administered 2014-08-18: 4 mg via INTRAVENOUS

## 2014-08-18 MED ORDER — PROPOFOL 10 MG/ML IV BOLUS
INTRAVENOUS | Status: AC
  Filled 2014-08-18: qty 20

## 2014-08-18 MED ORDER — ONDANSETRON HCL 4 MG/2ML IJ SOLN
INTRAMUSCULAR | Status: AC
  Filled 2014-08-18: qty 2

## 2014-08-18 MED ORDER — PROMETHAZINE HCL 25 MG/ML IJ SOLN: 6.2500 mg | INTRAMUSCULAR | Status: DC | PRN

## 2014-08-18 SURGICAL SUPPLY — 28 items
CABLE HIGH FREQUENCY MONO STRZ (ELECTRODE) IMPLANT
CATH ROBINSON RED A/P 16FR (CATHETERS) IMPLANT
CLOTH BEACON ORANGE TIMEOUT ST (SAFETY) ×2 IMPLANT
DRSG COVADERM PLUS 2X2 (GAUZE/BANDAGES/DRESSINGS) ×2 IMPLANT
DRSG OPSITE POSTOP 3X4 (GAUZE/BANDAGES/DRESSINGS) ×2 IMPLANT
DURAPREP 26ML APPLICATOR (WOUND CARE) ×2 IMPLANT
GLOVE BIO SURGEON STRL SZ 6.5 (GLOVE) ×2 IMPLANT
GLOVE BIOGEL PI IND STRL 7.0 (GLOVE) ×1 IMPLANT
GLOVE BIOGEL PI INDICATOR 7.0 (GLOVE) ×1
GOWN STRL REUS W/TWL LRG LVL3 (GOWN DISPOSABLE) ×4 IMPLANT
LIQUID BAND (GAUZE/BANDAGES/DRESSINGS) ×2 IMPLANT
NEEDLE INSUFFLATION 120MM (ENDOMECHANICALS) IMPLANT
NS IRRIG 1000ML POUR BTL (IV SOLUTION) ×2 IMPLANT
PACK LAPAROSCOPY BASIN (CUSTOM PROCEDURE TRAY) ×2 IMPLANT
PAD POSITIONER PINK NONSTERILE (MISCELLANEOUS) ×2 IMPLANT
POUCH SPECIMEN RETRIEVAL 10MM (ENDOMECHANICALS) ×2 IMPLANT
PROTECTOR NERVE ULNAR (MISCELLANEOUS) IMPLANT
SET IRRIG TUBING LAPAROSCOPIC (IRRIGATION / IRRIGATOR) IMPLANT
SHEARS HARMONIC ACE PLUS 36CM (ENDOMECHANICALS) ×2 IMPLANT
STRIP CLOSURE SKIN 1/2X4 (GAUZE/BANDAGES/DRESSINGS) IMPLANT
SUT VICRYL 0 UR6 27IN ABS (SUTURE) ×2 IMPLANT
SUT VICRYL 4-0 PS2 18IN ABS (SUTURE) ×2 IMPLANT
TOWEL OR 17X24 6PK STRL BLUE (TOWEL DISPOSABLE) ×4 IMPLANT
TRAY FOLEY CATH SILVER 14FR (SET/KITS/TRAYS/PACK) ×2 IMPLANT
TROCAR XCEL DIL TIP R 11M (ENDOMECHANICALS) ×2 IMPLANT
TROCAR XCEL NON-BLD 5MMX100MML (ENDOMECHANICALS) ×2 IMPLANT
WARMER LAPAROSCOPE (MISCELLANEOUS) ×2 IMPLANT
WATER STERILE IRR 1000ML POUR (IV SOLUTION) ×2 IMPLANT

## 2014-08-18 NOTE — Discharge Instructions (Signed)
Salpingectomy, Care After Refer to this sheet in the next few weeks. These instructions provide you with information on caring for yourself after your procedure. Your health care provider may also give you more specific instructions. Your treatment has been planned according to current medical practices, but problems sometimes occur. Call your health care provider if you have any problems or questions after your procedure. WHAT TO EXPECT AFTER THE PROCEDURE After your procedure, it is typical to have the following:  Abdominal pain that can be controlled with pain medicine.  Vaginal spotting.  Tiredness. HOME CARE INSTRUCTIONS  Get plenty of rest and sleep.  Only take over-the-counter or prescription medicines as directed by your health care provider.  Keep incision areas clean and dry. Remove or change any bandages (dressings) only as directed by your health care provider.  You may resume your regular diet. Eat a well-balanced diet.  Drink enough fluids to keep your urine clear or pale yellow.  Limit exercise and activities as directed by your health care provider. Do not lift anything heavier than 5 lb (2.3 kg) until your health care provider approves.  Do not drive until your health care provider approves.  Do not have sexual intercourse until your health care provider says it is okay.  Take your temperature twice a day for the first week. Write those temperatures down.  Follow up with your health care provider as directed. SEEK MEDICAL CARE IF:  You have pain when you urinate.  You see pus coming out of the incision, or the incision is separating.  You have increasing abdominal pain.  You have swelling or redness in the incision area.  You develop a rash.  You feel lightheaded.  You have pain that is not controlled with medicine. SEEK IMMEDIATE MEDICAL CARE IF:  You develop a fever.  You have increasing abdominal pain.  You develop chest or leg pain.  You  develop shortness of breath.  You pass out. Document Released: 06/15/2010 Document Revised: 12/30/2012 Document Reviewed: 09/02/2012 [facility] Patient Information 2015 [place], [place]. This information is not intended to replace advice given to you by your health care provider. Make sure you discuss any questions you have with your health care provider.

## 2014-08-18 NOTE — Transfer of Care (Signed)
Immediate Anesthesia Transfer of Care Note  Patient: Jill Vargas  Procedure(s) Performed: Procedure(s): LAPAROSCOPIC LEFT  SALPINGECTOMY (Left)  Patient Location: PACU  Anesthesia Type:General  Level of Consciousness: awake  Airway & Oxygen Therapy: Patient Spontanous Breathing  Post-op Assessment: Report given to PACU RN  Post vital signs: stable  Filed Vitals:   08/18/14 1223  BP: 121/79  Pulse: 64  Temp: 36.7 C  Resp: 18    Complications: No apparent anesthesia complications

## 2014-08-18 NOTE — Addendum Note (Signed)
Addendum  created 08/18/14 1722 by Josephine Igo, MD   Modules edited: Orders, PRL Based Order Sets

## 2014-08-18 NOTE — H&P (Signed)
Chief Complaint  Patient presents with  . Follow-up  LLQ pain  HPI Jill Vargas is a 49 y.o. female. [id number] No LMP recorded. Patient has had an ablation. LLQ pain is mild but wants to have surgery to resolve. Hydrosalpinx on [place] persists  HPI  Past Medical History  Diagnosis Date  . Fibroid uterus   . Hyperlipemia   . Constipation   . Headache   . Menorrhagia   . Hydrosalpinx 10/2013    left  . Chronic pelvic pain in female   . Chronic back pain   . Ovarian cyst 11/2013    left    Past Surgical History  Procedure Laterality Date  . Salpingectomy  1998  . Appendectomy    . Dilitation & currettage/hystroscopy with novasure ablation  04/06/2012    Procedure: DILATATION & CURETTAGE/HYSTEROSCOPY WITH NOVASURE ABLATION; Surgeon: Lavonia Drafts, MD; Location: [facility]; Service: Gynecology; Laterality: N/A;    Family History  Problem Relation Age of Onset  . Hypertension Mother   . Diabetes Father     Social History History  Substance Use Topics  . Smoking status: Former Research scientist (life sciences)  . Smokeless tobacco: Never Used  . Alcohol Use: No    No Known Allergies  Current Outpatient Prescriptions  Medication Sig Dispense Refill  . Ascorbic Acid (VITAMIN C PO) Take 1 tablet by mouth daily.    Marland Kitchen atorvastatin (LIPITOR) 20 MG tablet Take 20 mg by mouth daily.    . diclofenac (CATAFLAM) 50 MG tablet Take 1 tablet (50 mg total) by mouth 3 (three) times daily with meals as needed (moderate pain). 60 tablet 1  . VITAMIN E PO Take 1 tablet by mouth daily.    Marland Kitchen doxycycline (VIBRA-TABS) 100 MG tablet Take 1 tablet (100 mg total) by mouth 2 (two) times daily. (Patient not taking: Reported on 05/14/2014) 28 tablet 0  . HYDROcodone-acetaminophen (NORCO/VICODIN) 5-325 MG per tablet 1 or 2 tabs PO q6 hours prn pain (Patient not taking: Reported on 05/14/2014) 20  tablet 0  . hydrocortisone (ANUSOL-HC) 25 MG suppository Place 1 suppository (25 mg total) rectally 2 (two) times daily. 12 suppository 1  . OVER THE COUNTER MEDICATION Take 1 tablet by mouth daily. Estrogen Vitamin    . traMADol (ULTRAM) 50 MG tablet Take 1 tablet (50 mg total) by mouth every 6 (six) hours as needed for severe pain. (Patient not taking: Reported on 04/20/2014) 60 tablet 0   No current facility-administered medications for this visit.    Review of Systems Review of Systems  Constitutional: Negative.  Gastrointestinal: Positive for abdominal pain and constipation.   Hemorrhoid  Genitourinary: Positive for pelvic pain. Negative for vaginal bleeding and vaginal pain.    There were no vitals taken for this visit.  Physical Exam Physical Exam  Constitutional: She is oriented to person, place, and time. She appears well-developed. No distress.  Pulmonary/Chest: Effort normal. No respiratory distress.  Abdominal: Soft. She exhibits no mass. There is no tenderness.  Neurological: She is alert and oriented to person, place, and time.  Psychiatric: She has a normal mood and affect. Her behavior is normal.    Data Reviewed [place] result  CLINICAL DATA: Left lower quadrant and pelvic pain. Vaginal discharge  EXAM: TRANSABDOMINAL AND TRANSVAGINAL ULTRASOUND OF PELVIS  TECHNIQUE: Study was performed transabdominally to optimize pelvic field of view evaluation and transvaginally to optimize internal visceral architecture evaluation.  COMPARISON: May 31, 2014  FINDINGS: Uterus  Measurements: 7.7 x 3.5 x 4.4 cm. There is  no well-defined intrauterine mass. However, the echotexture of the uterus is inhomogeneous which raises question of underlying leiomyomatous change throughout the myometrium.  Endometrium  Thickness: 3 mm. A small amount of fluid is seen within the endometrium. No mass or polyp seen.  Right  ovary  Measurements: 2.2 x 1.3 x 1.3 cm. Normal appearance/no adnexal mass.  Left ovary  Measurements: 2.2 x 1.7 x 1.2 cm. There is again noted an approximately 5.3 x 1.2 x 1.3 cm irregular fluid collection in the left adnexal region, felt to represent persistence of hydrosalpinx. No new lesion identified. Incidental note is made of a dominant follicle in the left ovary measuring 1.3 x 0.6 x 0.7 cm.  Other findings  No free fluid.  IMPRESSION: Persist and essentially stable hydrosalpinx on the left. Dominant follicle left ovary. Suspect leiomyomatous change in the uterus, although there is no well-defined dominant uterine mass. Small amount of fluid in the endometrium likely represents hemorrhage. Endometrium is not thickened.   Electronically Signed  By: Lowella Grip III M.D.  On: 07/05/2014 08:13    CBC    Component Value Date/Time   WBC 6.0 08/09/2014 0930   RBC 4.82 08/09/2014 0930   HGB 14.2 08/09/2014 0930   HCT 41.6 08/09/2014 0930   PLT 216 08/09/2014 0930   MCV 86.3 08/09/2014 0930   MCH 29.5 08/09/2014 0930   MCHC 34.1 08/09/2014 0930   RDW 12.8 08/09/2014 0930   LYMPHSABS 2.9 07/05/2014 0302   MONOABS 0.7 07/05/2014 0302   EOSABS 0.2 07/05/2014 0302   BASOSABS 0.0 07/05/2014 0302        Assessment    Left hydrosalpinx and pain     Plan    Laparoscopy and left salpingectomy, The procedure and the risk of anesthesia, pain, bleeding, infection, bowel and bladder injury were discussed and her questions were answered. The procedure will be scheduled as an outpatient.      Woodroe Mode, MD 08/18/2014 3:13 PM

## 2014-08-18 NOTE — Anesthesia Postprocedure Evaluation (Signed)
  Anesthesia Post-op Note  Patient: Jill Vargas  Procedure(s) Performed: Procedure(s): LAPAROSCOPIC LEFT  SALPINGECTOMY (Left)  Patient Location: PACU  Anesthesia Type:General  Level of Consciousness: awake, alert  and oriented  Airway and Oxygen Therapy: Patient Spontanous Breathing  Post-op Pain: mild  Post-op Assessment: Post-op Vital signs reviewed, Patient's Cardiovascular Status Stable, Respiratory Function Stable, Patent Airway, No signs of Nausea or vomiting and Pain level controlled  Post-op Vital Signs: Reviewed and stable  Last Vitals:  Filed Vitals:   08/18/14 1700  BP:   Pulse: 58  Temp:   Resp: 15    Complications: No apparent anesthesia complications

## 2014-08-18 NOTE — Op Note (Signed)
Diagnostic Laparoscopy Procedure Note  Indications: The patient is a 49 y.o. female with pelvic pain and left hydrosalpinx.  Pre-operative Diagnosis: pelvic pain and left hydrosalpinx  Post-operative Diagnosis: same  Surgeon: Hanson: none  Anesthesia: General endotracheal anesthesia  ASA Class: 1  Procedure Details  The patient was seen in the Holding Room. The risks, benefits, complications, treatment options, and expected outcomes were discussed with the patient. The possibilities of reaction to medication, pulmonary aspiration, perforation of viscus, bleeding, recurrent infection, the need for additional procedures, failure to diagnose a condition, and creating a complication requiring transfusion or operation were discussed with the patient. The patient concurred with the proposed plan, giving informed consent. The patient was taken to the Operating Room, identified as [facility] and the procedure verified as diagnostic laparoscopy and left salpingectomy. A Time Out was held and the above information confirmed.   After induction of general anesthesia, the patient was placed in modified dorsal lithotomy position where she was prepped, draped, and catheterized in the normal, sterile fashion.  The cervix was visualized and an intrauterine manipulator was placed. A 1 cm umbilical incision was then performed. 5 mm trocar was passed and pneumoperitoneum was established. The diagnostic laparoscope was inserted with video camera and use. The Hulka tenaculum was used to manipulate the uterus. The right ovary appeared normal and the the right fallopian tube was absent from previous surgery a 5 mm port was placed in the left lower quadrant and a manipulator was placed in the left  fallopian tube was identified and was swollen consistent with hydrosalpinx. There were no adhesions in the pelvis. A 10 mm trocar was placed in the right lower quadrant. The harmonic scalpel was used  to excise the left fallopian tube in good hemostasis was seen. The Endo Catch bag was placed in the specimen was removed. The left ovary also appeared normal. The pneumoperitoneum was released and instruments were removed. 0 suture was placed in the fascia at the right lower quadrant incision. Interrupted subcuticular sutures with 4 x 4 were placed to close the skin and liquid band was used to close the incisions. The Hulka tenaculum was removed. Patient tolerated the procedure well without complications and was brought in stable condition to PACU.     Instrument, sponge, and needle counts were correct prior to abdominal closure and at the conclusion of the case.   Findings: The anterior cul-de-sac and round ligaments normal The uterus normal The adnexa as above Cul-de-sac normal  Estimated Blood Loss:  Minimal         Drains: none         Total IV Fluids: 1000 mL         Specimens: Left fallopian tube              Complications:  None; patient tolerated the procedure well.         Disposition: PACU - hemodynamically stable.         Condition: stable  Woodroe Mode, MD 08/18/2014 4:51 PM

## 2014-08-19 ENCOUNTER — Encounter (HOSPITAL_COMMUNITY): Payer: Self-pay | Admitting: Obstetrics & Gynecology

## 2014-08-30 ENCOUNTER — Ambulatory Visit: Payer: 59 | Admitting: *Deleted

## 2014-08-30 ENCOUNTER — Telehealth: Payer: Self-pay | Admitting: General Practice

## 2014-08-30 DIAGNOSIS — R300 Dysuria: Secondary | ICD-10-CM

## 2014-08-30 LAB — POCT URINALYSIS DIP (DEVICE)
BILIRUBIN URINE: NEGATIVE
GLUCOSE, UA: NEGATIVE mg/dL
Ketones, ur: NEGATIVE mg/dL
Nitrite: NEGATIVE
Protein, ur: NEGATIVE mg/dL
Specific Gravity, Urine: >=1.03 (ref 1.005–1.030)
Urobilinogen, UA: 0.2 mg/dL (ref 0.0–1.0)
pH: 5 (ref 5.0–8.0)

## 2014-08-30 NOTE — Progress Notes (Signed)
Urine sent for culture. Pt reports dysuria. Advised her to hydrate and take azo standard.

## 2014-08-30 NOTE — Telephone Encounter (Signed)
Patient called into front office stating she had surgery a couple weeks ago and has been having pelvic pain and urinating a lot and feels like she has a UTI. Asked patient if she could come in today to give a urine sample. Patient states she will be here around 930. Patient had no other questions

## 2014-09-01 ENCOUNTER — Telehealth: Payer: Self-pay | Admitting: *Deleted

## 2014-09-01 LAB — URINE CULTURE

## 2014-09-01 NOTE — Telephone Encounter (Signed)
Pt contacted the clinic requesting results from urine culture, reviewed with W. Sharyon Medicus CNM, no medication needed.  Contacted patient, results given, pt verbalizes understanding.

## 2014-09-14 ENCOUNTER — Ambulatory Visit: Payer: 59 | Admitting: Obstetrics & Gynecology

## 2014-10-07 ENCOUNTER — Ambulatory Visit: Payer: 59 | Admitting: Obstetrics & Gynecology

## 2015-03-27 ENCOUNTER — Emergency Department (HOSPITAL_COMMUNITY): Payer: 59

## 2015-03-27 ENCOUNTER — Encounter (HOSPITAL_COMMUNITY): Payer: Self-pay

## 2015-03-27 ENCOUNTER — Emergency Department (HOSPITAL_COMMUNITY)
Admission: EM | Admit: 2015-03-27 | Discharge: 2015-03-27 | Disposition: A | Discharge status: Home / Self Care | Payer: 59 | Attending: Emergency Medicine | Admitting: Emergency Medicine

## 2015-03-27 DIAGNOSIS — N898 Other specified noninflammatory disorders of vagina: Secondary | ICD-10-CM

## 2015-03-27 DIAGNOSIS — Z86018 Personal history of other benign neoplasm: Secondary | ICD-10-CM | POA: Insufficient documentation

## 2015-03-27 DIAGNOSIS — Z87891 Personal history of nicotine dependence: Secondary | ICD-10-CM | POA: Insufficient documentation

## 2015-03-27 DIAGNOSIS — M79605 Pain in left leg: Secondary | ICD-10-CM | POA: Insufficient documentation

## 2015-03-27 DIAGNOSIS — Z8719 Personal history of other diseases of the digestive system: Secondary | ICD-10-CM | POA: Insufficient documentation

## 2015-03-27 DIAGNOSIS — O26899 Other specified pregnancy related conditions, unspecified trimester: Secondary | ICD-10-CM

## 2015-03-27 DIAGNOSIS — E785 Hyperlipidemia, unspecified: Secondary | ICD-10-CM | POA: Insufficient documentation

## 2015-03-27 DIAGNOSIS — Z79899 Other long term (current) drug therapy: Secondary | ICD-10-CM | POA: Insufficient documentation

## 2015-03-27 DIAGNOSIS — R102 Pelvic and perineal pain: Secondary | ICD-10-CM | POA: Diagnosis not present

## 2015-03-27 DIAGNOSIS — R109 Unspecified abdominal pain: Secondary | ICD-10-CM | POA: Diagnosis present

## 2015-03-27 DIAGNOSIS — G8929 Other chronic pain: Secondary | ICD-10-CM | POA: Insufficient documentation

## 2015-03-27 DIAGNOSIS — R350 Frequency of micturition: Secondary | ICD-10-CM | POA: Insufficient documentation

## 2015-03-27 DIAGNOSIS — M545 Low back pain: Secondary | ICD-10-CM | POA: Diagnosis not present

## 2015-03-27 DIAGNOSIS — R300 Dysuria: Secondary | ICD-10-CM | POA: Diagnosis not present

## 2015-03-27 DIAGNOSIS — Z3202 Encounter for pregnancy test, result negative: Secondary | ICD-10-CM | POA: Diagnosis not present

## 2015-03-27 DIAGNOSIS — R1032 Left lower quadrant pain: Secondary | ICD-10-CM | POA: Insufficient documentation

## 2015-03-27 DIAGNOSIS — R52 Pain, unspecified: Secondary | ICD-10-CM

## 2015-03-27 LAB — URINE MICROSCOPIC-ADD ON: RBC / HPF: NONE SEEN RBC/hpf (ref 0–5)

## 2015-03-27 LAB — URINALYSIS, ROUTINE W REFLEX MICROSCOPIC
Bilirubin Urine: NEGATIVE
Glucose, UA: NEGATIVE mg/dL
Hgb urine dipstick: NEGATIVE
Ketones, ur: NEGATIVE mg/dL
NITRITE: NEGATIVE
PH: 5.5 (ref 5.0–8.0)
Protein, ur: NEGATIVE mg/dL
SPECIFIC GRAVITY, URINE: 1.023 (ref 1.005–1.030)

## 2015-03-27 LAB — COMPREHENSIVE METABOLIC PANEL
ALT: 36 U/L (ref 14–54)
AST: 25 U/L (ref 15–41)
Albumin: 4.2 g/dL (ref 3.5–5.0)
Alkaline Phosphatase: 91 U/L (ref 38–126)
Anion gap: 9 (ref 5–15)
BUN: 13 mg/dL (ref 6–20)
CO2: 25 mmol/L (ref 22–32)
Calcium: 9.7 mg/dL (ref 8.9–10.3)
Chloride: 107 mmol/L (ref 101–111)
Creatinine, Ser: 0.78 mg/dL (ref 0.44–1.00)
GFR calc Af Amer: >60 mL/min (ref >60)
Glucose, Bld: 162 mg/dL — ABNORMAL HIGH (ref 65–99)
POTASSIUM: 3.8 mmol/L (ref 3.5–5.1)
Sodium: 141 mmol/L (ref 135–145)
TOTAL PROTEIN: 7.1 g/dL (ref 6.5–8.1)
Total Bilirubin: 0.8 mg/dL (ref 0.3–1.2)

## 2015-03-27 LAB — WET PREP, GENITAL
Clue Cells Wet Prep HPF POC: NONE SEEN
Sperm: NONE SEEN
Trich, Wet Prep: NONE SEEN
YEAST WET PREP: NONE SEEN

## 2015-03-27 LAB — CBC
HCT: 44.1 % (ref 36.0–46.0)
Hemoglobin: 14.8 g/dL (ref 12.0–15.0)
MCH: 29.9 pg (ref 26.0–34.0)
MCHC: 33.6 g/dL (ref 30.0–36.0)
MCV: 89.1 fL (ref 78.0–100.0)
Platelets: 233 10*3/uL (ref 150–400)
RBC: 4.95 MIL/uL (ref 3.87–5.11)
RDW: 12.9 % (ref 11.5–15.5)
WBC: 7.4 10*3/uL (ref 4.0–10.5)

## 2015-03-27 LAB — PREGNANCY, URINE: PREG TEST UR: NEGATIVE

## 2015-03-27 LAB — LIPASE, BLOOD: Lipase: 34 U/L (ref 11–51)

## 2015-03-27 MED ORDER — HYDROCODONE-ACETAMINOPHEN 5-325 MG PO TABS: 1.0000 | ORAL_TABLET | ORAL | Status: DC | PRN

## 2015-03-27 MED ORDER — OXYCODONE-ACETAMINOPHEN 5-325 MG PO TABS
1.0000 | ORAL_TABLET | Freq: Once | ORAL | Status: AC
  Administered 2015-03-27: 1 via ORAL
  Filled 2015-03-27: qty 1

## 2015-03-27 MED ORDER — AZITHROMYCIN 250 MG PO TABS
500.0000 mg | ORAL_TABLET | Freq: Once | ORAL | Status: AC
  Administered 2015-03-27: 500 mg via ORAL
  Filled 2015-03-27: qty 2

## 2015-03-27 MED ORDER — ONDANSETRON HCL 4 MG/2ML IJ SOLN
4.0000 mg | Freq: Once | INTRAMUSCULAR | Status: AC
  Administered 2015-03-27: 4 mg via INTRAVENOUS
  Filled 2015-03-27: qty 2

## 2015-03-27 MED ORDER — HYDROMORPHONE HCL 1 MG/ML IJ SOLN
1.0000 mg | Freq: Once | INTRAMUSCULAR | Status: AC
  Administered 2015-03-27: 1 mg via INTRAVENOUS
  Filled 2015-03-27: qty 1

## 2015-03-27 MED ORDER — SODIUM CHLORIDE 0.9 % IV BOLUS (SEPSIS)
500.0000 mL | Freq: Once | INTRAVENOUS | Status: AC
  Administered 2015-03-27: 500 mL via INTRAVENOUS

## 2015-03-27 MED ORDER — LIDOCAINE HCL (PF) 1 % IJ SOLN
INTRAMUSCULAR | Status: AC
  Administered 2015-03-27: 2.1 mL
  Filled 2015-03-27: qty 5

## 2015-03-27 MED ORDER — CEFTRIAXONE SODIUM 1 G IJ SOLR
1.0000 g | Freq: Once | INTRAMUSCULAR | Status: AC
  Administered 2015-03-27: 1 g via INTRAMUSCULAR
  Filled 2015-03-27: qty 10

## 2015-03-27 NOTE — ED Notes (Signed)
Pt transported to US

## 2015-03-27 NOTE — ED Notes (Signed)
Pt presents with c/o left side flank pain for a couple of days. Pt reports the pain has just gotten progressively worse. Pt also c/o mid area abdominal pain as well. Pt denies any hx of kidney stones, does report some diarrhea.

## 2015-03-27 NOTE — ED Provider Notes (Signed)
CSN: [id number]     Arrival date & time 03/27/15  1134 History   First MD Initiated Contact with Patient 03/27/15 1504     Chief Complaint  Patient presents with  . Flank Pain  . Abdominal Pain     (Consider location/radiation/quality/duration/timing/severity/associated sxs/prior Treatment) HPI  Jill Vargas is a 50 y.o. female who presents for evaluation of left flank pain which radiates to her left anterior abdomen, and into her left leg. The pain has been present for 3 days. The pain reminds her of pain she had when she had a fallopian tube mass that was removed several months ago. She denies fever, chills, nausea or vomiting. She has urinary frequency and dysuria without hematuria. She is able to walk. She does not have chronic back pain. There are no other known modifying factors.    Past Medical History  Diagnosis Date  . Fibroid uterus   . Hyperlipemia     diet controlled - no meds  . Constipation     history  . Menorrhagia   . Hydrosalpinx 10/2013    left  . Chronic pelvic pain in female   . Chronic back pain     history related to cyst/fibroids  . Ovarian cyst 11/2013    left  . SVD (spontaneous vaginal delivery)     x 3   Past Surgical History  Procedure Laterality Date  . Salpingectomy  1998  . Appendectomy    . Dilitation & currettage/hystroscopy with novasure ablation  04/06/2012    Procedure: DILATATION & CURETTAGE/HYSTEROSCOPY WITH NOVASURE ABLATION;  Surgeon: Lavonia Drafts, MD;  Location: [facility];  Service: Gynecology;  Laterality: N/A;  . Ablation    . Wisdom tooth extraction    . Laparoscopic bilateral salpingectomy Left 08/18/2014    Procedure: LAPAROSCOPIC LEFT  SALPINGECTOMY;  Surgeon: Woodroe Mode, MD;  Location: [facility];  Service: Gynecology;  Laterality: Left;   Family History  Problem Relation Age of Onset  . Hypertension Mother   . Diabetes Father    Social History  Substance Use Topics  . Smoking status: Former Smoker -- 0.25  packs/day for 8 years    Types: Cigarettes    Quit date: 01/23/2001  . Smokeless tobacco: Never Used  . Alcohol Use: No   OB History    Gravida Para Term Preterm AB TAB SAB Ectopic Multiple Living   3 3 1 2  0 0 0 0 0 3     Review of Systems  All other systems reviewed and are negative.     Allergies  Review of patient's allergies indicates no known allergies.  Home Medications   Prior to Admission medications   Medication Sig Start Date End Date Taking? Authorizing Provider  acetaminophen (TYLENOL) 500 MG tablet Take 500 mg by mouth every 6 (six) hours as needed for moderate pain.   Yes Historical Provider, MD  HYDROcodone-acetaminophen (NORCO) 5-325 MG per tablet Take 1 tablet by mouth every 6 (six) hours as needed. 07/05/14  Yes Evelina Bucy, MD  rosuvastatin (CRESTOR) 10 MG tablet Take 10 mg by mouth daily.   Yes Historical Provider, MD  diclofenac (CATAFLAM) 50 MG tablet Take 1 tablet (50 mg total) by mouth 3 (three) times daily with meals as needed (moderate pain). Patient not taking: Reported on 07/05/2014 01/03/14   Lavonia Drafts, MD  hydrocortisone (ANUSOL-HC) 25 MG suppository Place 1 suppository (25 mg total) rectally 2 (two) times daily. Patient not taking: Reported on 07/05/2014 06/24/14   Alvina Filbert  Roselie Awkward, MD  oxyCODONE-acetaminophen (PERCOCET/ROXICET) 5-325 MG per tablet Take 1-2 tablets by mouth every 6 (six) hours as needed. Patient not taking: Reported on 03/27/2015 08/18/14   Woodroe Mode, MD  traMADol (ULTRAM) 50 MG tablet Take 1 tablet (50 mg total) by mouth every 6 (six) hours as needed for severe pain. Patient not taking: Reported on 04/20/2014 01/03/14   Lavonia Drafts, MD   BP 128/74 mmHg  Pulse 60  Temp(Src) 97.9 F (36.6 C) (Oral)  Resp 15  SpO2 97% Physical Exam  Constitutional: She is oriented to person, place, and time. She appears well-developed and well-nourished.  HENT:  Head: Normocephalic and atraumatic.  Right Ear: External ear  normal.  Left Ear: External ear normal.  Eyes: Conjunctivae and EOM are normal. Pupils are equal, round, and reactive to light.  Neck: Normal range of motion and phonation normal. Neck supple.  Cardiovascular: Normal rate, regular rhythm and normal heart sounds.   Pulmonary/Chest: Effort normal and breath sounds normal. She exhibits no bony tenderness.  Abdominal: Soft. She exhibits no mass. There is tenderness (Mild left lower abdominal tenderness without mass, deformity or swelling.). There is no rebound and no guarding.  Musculoskeletal:  Tender left flank and left upper leg. Somewhat diminished flexion of the lumbar spine secondary to pain.  Neurological: She is alert and oriented to person, place, and time. No cranial nerve deficit or sensory deficit. She exhibits normal muscle tone. Coordination normal.  Skin: Skin is warm, dry and intact.  Psychiatric: She has a normal mood and affect. Her behavior is normal. Judgment and thought content normal.  Nursing note and vitals reviewed.   ED Course  Procedures (including critical care time)  Medications  sodium chloride 0.9 % bolus 500 mL (0 mLs Intravenous Stopped 03/27/15 1829)  HYDROmorphone (DILAUDID) injection 1 mg (1 mg Intravenous Given 03/27/15 1558)  ondansetron (ZOFRAN) injection 4 mg (4 mg Intravenous Given 03/27/15 1558)  oxyCODONE-acetaminophen (PERCOCET/ROXICET) 5-325 MG per tablet 1 tablet (1 tablet Oral Given 03/27/15 1847)  cefTRIAXone (ROCEPHIN) injection 1 g (1 g Intramuscular Given 03/27/15 1900)  azithromycin (ZITHROMAX) tablet 500 mg (500 mg Oral Given 03/27/15 1900)  lidocaine (PF) (XYLOCAINE) 1 % injection (2.1 mLs  Given 03/27/15 1903)   Patient treated for possible cervicitis, in the emergency department  Patient Vitals for the past 24 hrs:  BP Temp Temp src Pulse Resp SpO2  03/27/15 1836 119/64 mmHg 97.6 F (36.4 C) Oral 62 16 97 %  03/27/15 1523 128/74 mmHg 97.9 F (36.6 C) Oral 60 15 97 %  03/27/15 1148 114/80 mmHg  98.1 F (36.7 C) Oral 72 15 96 %    8:07 PM Reevaluation with update and discussion. After initial assessment and treatment, an updated evaluation reveals no additional complaints, findings discussed with the patient, all questions answered. Nala Kachel L    Labs Review Labs Reviewed  COMPREHENSIVE METABOLIC PANEL - Abnormal; Notable for the following:    Glucose, Bld 162 (*)    All other components within normal limits  URINALYSIS, ROUTINE W REFLEX MICROSCOPIC (NOT AT [facility]) - Abnormal; Notable for the following:    APPearance CLOUDY (*)    Leukocytes, UA SMALL (*)    All other components within normal limits  URINE MICROSCOPIC-ADD ON - Abnormal; Notable for the following:    Squamous Epithelial / LPF 6-30 (*)    Bacteria, UA FEW (*)    All other components within normal limits  URINE CULTURE  LIPASE, BLOOD  CBC  PREGNANCY,  URINE    Imaging Review No results found. I have personally reviewed and evaluated these images and lab results as part of my medical decision-making.   EKG Interpretation None      MDM   Final diagnoses:  Pelvic pain affecting pregnancy  Vaginal discharge    Nonspecific pelvic pain, left low back pain and vaginal discharge. Possible cervicitis. Doubt TOA, or Location from prior pelvic surgery. Suspect component of sciatica as well. Nursing Notes Reviewed/ Care Coordinated Applicable Imaging Reviewed Interpretation of Laboratory Data incorporated into ED treatment  The patient appears reasonably screened and/or stabilized for discharge and I doubt any other medical condition or other [facility] requiring further screening, evaluation, or treatment in the ED at this time prior to discharge.  Plan: Home Medications- Norco; Home Treatments- rest; return here if the recommended treatment, does not improve the symptoms; Recommended follow up- PCP prn     Daleen Bo, MD 03/27/15 2008

## 2015-03-27 NOTE — Discharge Instructions (Signed)

## 2015-03-28 LAB — RPR: RPR Ser Ql: NONREACTIVE

## 2015-03-28 LAB — GC/CHLAMYDIA PROBE AMP (~~LOC~~) NOT AT ARMC
Chlamydia: NEGATIVE
NEISSERIA GONORRHEA: NEGATIVE

## 2015-03-28 LAB — HIV ANTIBODY (ROUTINE TESTING W REFLEX): HIV SCREEN 4TH GENERATION: NONREACTIVE

## 2015-03-29 LAB — URINE CULTURE: Culture: >=100000

## 2015-04-24 ENCOUNTER — Inpatient Hospital Stay (HOSPITAL_COMMUNITY)
Admission: AD | Admit: 2015-04-24 | Discharge: 2015-04-24 | Disposition: A | Discharge status: Home / Self Care | Payer: 59 | Source: Ambulatory Visit | Attending: Family Medicine | Admitting: Family Medicine

## 2015-04-24 ENCOUNTER — Encounter (HOSPITAL_COMMUNITY): Payer: Self-pay | Admitting: *Deleted

## 2015-04-24 DIAGNOSIS — R1031 Right lower quadrant pain: Secondary | ICD-10-CM

## 2015-04-24 DIAGNOSIS — R1032 Left lower quadrant pain: Secondary | ICD-10-CM | POA: Diagnosis present

## 2015-04-24 DIAGNOSIS — A0819 Acute gastroenteropathy due to other small round viruses: Secondary | ICD-10-CM | POA: Diagnosis not present

## 2015-04-24 DIAGNOSIS — Z3202 Encounter for pregnancy test, result negative: Secondary | ICD-10-CM | POA: Insufficient documentation

## 2015-04-24 DIAGNOSIS — G8929 Other chronic pain: Secondary | ICD-10-CM | POA: Diagnosis not present

## 2015-04-24 DIAGNOSIS — Z87891 Personal history of nicotine dependence: Secondary | ICD-10-CM | POA: Diagnosis not present

## 2015-04-24 DIAGNOSIS — E785 Hyperlipidemia, unspecified: Secondary | ICD-10-CM | POA: Diagnosis not present

## 2015-04-24 DIAGNOSIS — A0811 Acute gastroenteropathy due to Norwalk agent: Secondary | ICD-10-CM

## 2015-04-24 HISTORY — DX: Unspecified ectopic pregnancy without intrauterine pregnancy: O00.90

## 2015-04-24 LAB — CBC WITH DIFFERENTIAL/PLATELET
Basophils Absolute: 0 10*3/uL (ref 0.0–0.1)
Basophils Relative: 1 %
EOS PCT: 4 %
Eosinophils Absolute: 0.2 10*3/uL (ref 0.0–0.7)
HEMATOCRIT: 40.8 % (ref 36.0–46.0)
HEMOGLOBIN: 14 g/dL (ref 12.0–15.0)
LYMPHS ABS: 2.4 10*3/uL (ref 0.7–4.0)
LYMPHS PCT: 40 %
MCH: 29.7 pg (ref 26.0–34.0)
MCHC: 34.3 g/dL (ref 30.0–36.0)
MCV: 86.4 fL (ref 78.0–100.0)
Monocytes Absolute: 0.3 10*3/uL (ref 0.1–1.0)
Monocytes Relative: 6 %
Neutro Abs: 3 10*3/uL (ref 1.7–7.7)
Neutrophils Relative %: 51 %
Platelets: 204 10*3/uL (ref 150–400)
RBC: 4.72 MIL/uL (ref 3.87–5.11)
RDW: 12.9 % (ref 11.5–15.5)
WBC: 6 10*3/uL (ref 4.0–10.5)

## 2015-04-24 LAB — COMPREHENSIVE METABOLIC PANEL
ALK PHOS: 84 U/L (ref 38–126)
ALT: 27 U/L (ref 14–54)
ANION GAP: 9 (ref 5–15)
AST: 22 U/L (ref 15–41)
Albumin: 3.9 g/dL (ref 3.5–5.0)
BILIRUBIN TOTAL: 0.9 mg/dL (ref 0.3–1.2)
BUN: 13 mg/dL (ref 6–20)
CALCIUM: 9.2 mg/dL (ref 8.9–10.3)
CO2: 25 mmol/L (ref 22–32)
CREATININE: 0.73 mg/dL (ref 0.44–1.00)
Chloride: 107 mmol/L (ref 101–111)
GFR calc non Af Amer: >60 mL/min (ref >60)
Glucose, Bld: 107 mg/dL — ABNORMAL HIGH (ref 65–99)
Potassium: 4 mmol/L (ref 3.5–5.1)
SODIUM: 141 mmol/L (ref 135–145)
TOTAL PROTEIN: 6.6 g/dL (ref 6.5–8.1)

## 2015-04-24 LAB — URINALYSIS, ROUTINE W REFLEX MICROSCOPIC
Bilirubin Urine: NEGATIVE
GLUCOSE, UA: NEGATIVE mg/dL
HGB URINE DIPSTICK: NEGATIVE
Ketones, ur: NEGATIVE mg/dL
Leukocytes, UA: NEGATIVE
Nitrite: NEGATIVE
Protein, ur: NEGATIVE mg/dL
pH: 5.5 (ref 5.0–8.0)

## 2015-04-24 LAB — WET PREP, GENITAL
CLUE CELLS WET PREP: NONE SEEN
Sperm: NONE SEEN
TRICH WET PREP: NONE SEEN
Yeast Wet Prep HPF POC: NONE SEEN

## 2015-04-24 LAB — POCT PREGNANCY, URINE: PREG TEST UR: NEGATIVE

## 2015-04-24 MED ORDER — KETOROLAC TROMETHAMINE 60 MG/2ML IM SOLN
60.0000 mg | Freq: Once | INTRAMUSCULAR | Status: AC
  Administered 2015-04-24: 60 mg via INTRAMUSCULAR
  Filled 2015-04-24: qty 2

## 2015-04-24 MED ORDER — IBUPROFEN 800 MG PO TABS: 800.0000 mg | ORAL_TABLET | Freq: Three times a day (TID) | ORAL | Status: DC

## 2015-04-24 NOTE — MAU Note (Signed)
Had to go to emergency room for the same thing a couple  wks ago.  Had surgery last spring, tube removed, ? Tumor.  States this is the same pain she was having then.  Is nauseous. Has d/c.

## 2015-04-24 NOTE — MAU Provider Note (Signed)
History     CSN: [id number]  Arrival date and time: 04/24/15 1022   First Provider Initiated Contact with Patient 04/24/15 1200      Chief Complaint  Patient presents with  . Abdominal Pain  . Back Pain  . Vaginal Discharge   HPI Comments: Mrs. Jill Vargas is a 50 yo female with history of hydrosalpinx s/p left salpingectomy (March 2016) and hyperlipidemia who presents today for LLQ, left flank, and left lower leg pain x [redacted] weeks along with N/V/D (pain is chronic in nature). She was seen at the ER 2 weeks ago and was diagnosed with possible cervicitis and given abx (ceftriaxone and azithromycin). She states that her pain has continued to get worse. She describes it as a constant sharp, stabbing pain that radiates from her left flank to lower abdomen and down her left lower leg. Nothing makes it better or worse. She complains of N/V and several episodes of watery diarrhea since yesterday. She denies any fever or chills. She denies any dysuria, hematuria, urgency or frequency. She admits to small amount of thin, white discharge w/o odor. Denies any vaginal bleeding. Patient is sexually active with husband.     OB History    Gravida Para Term Preterm AB TAB SAB Ectopic Multiple Living   _0 0 0 1 0 3      Past Medical History  Diagnosis Date  . Fibroid uterus   . Hyperlipemia     diet controlled - no meds  . Constipation     history  . Menorrhagia   . Hydrosalpinx 10/2013    left  . Chronic pelvic pain in female   . Chronic back pain     history related to cyst/fibroids  . Ovarian cyst 11/2013    left  . SVD (spontaneous vaginal delivery)     x 3  . Ectopic pregnancy     surgical removal  . Infection     UTI    Past Surgical History  Procedure Laterality Date  . Salpingectomy  1998  . Appendectomy    . Dilitation & currettage/hystroscopy with novasure ablation  04/06/2012    Procedure: DILATATION & CURETTAGE/HYSTEROSCOPY WITH NOVASURE ABLATION;  Surgeon: Lavonia Drafts, MD;  Location: [facility];  Service: Gynecology;  Laterality: N/A;  . Ablation    . Wisdom tooth extraction    . Laparoscopic bilateral salpingectomy Left 08/18/2014    Procedure: LAPAROSCOPIC LEFT  SALPINGECTOMY;  Surgeon: Woodroe Mode, MD;  Location: [facility];  Service: Gynecology;  Laterality: Left;    Family History  Problem Relation Age of Onset  . Hypertension Mother   . Diabetes Father     Social History  Substance Use Topics  . Smoking status: Former Smoker -- 0.25 packs/day for 8 years    Types: Cigarettes    Quit date: 01/23/2001  . Smokeless tobacco: Never Used  . Alcohol Use: No    Allergies: No Known Allergies  Prescriptions prior to admission  Medication Sig Dispense Refill Last Dose  . acetaminophen (TYLENOL) 500 MG tablet Take 500 mg by mouth every 6 (six) hours as needed for mild pain, moderate pain or headache.    04/23/2015 at Unknown time   Results for orders placed or performed during the hospital encounter of 04/24/15 (from the past 48 hour(s))  Urinalysis, Routine w reflex microscopic (not at [facility])     Status: Abnormal   Collection Time: 04/24/15 10:40 AM  Result Value Ref Range  Color, Urine YELLOW YELLOW   APPearance CLEAR CLEAR   Specific Gravity, Urine >1.030 (H) 1.005 - 1.030   pH 5.5 5.0 - 8.0   Glucose, UA NEGATIVE NEGATIVE mg/dL   Hgb urine dipstick NEGATIVE NEGATIVE   Bilirubin Urine NEGATIVE NEGATIVE   Ketones, ur NEGATIVE NEGATIVE mg/dL   Protein, ur NEGATIVE NEGATIVE mg/dL   Nitrite NEGATIVE NEGATIVE   Leukocytes, UA NEGATIVE NEGATIVE    Comment: MICROSCOPIC NOT DONE ON URINES WITH NEGATIVE PROTEIN, BLOOD, LEUKOCYTES, NITRITE, OR GLUCOSE <1000 mg/dL.  CBC with Differential     Status: None   Collection Time: 04/24/15 12:18 PM  Result Value Ref Range   WBC 6.0 4.0 - 10.5 K/uL   RBC 4.72 3.87 - 5.11 MIL/uL   Hemoglobin 14.0 12.0 - 15.0 g/dL   HCT 40.8 36.0 - 46.0 %   MCV 86.4 78.0 - 100.0 fL   MCH 29.7 26.0 - 34.0 pg    MCHC 34.3 30.0 - 36.0 g/dL   RDW 12.9 11.5 - 15.5 %   Platelets 204 150 - 400 K/uL   Neutrophils Relative % 51 %   Neutro Abs 3.0 1.7 - 7.7 K/uL   Lymphocytes Relative 40 %   Lymphs Abs 2.4 0.7 - 4.0 K/uL   Monocytes Relative 6 %   Monocytes Absolute 0.3 0.1 - 1.0 K/uL   Eosinophils Relative 4 %   Eosinophils Absolute 0.2 0.0 - 0.7 K/uL   Basophils Relative 1 %   Basophils Absolute 0.0 0.0 - 0.1 K/uL  Comprehensive metabolic panel     Status: Abnormal   Collection Time: 04/24/15 12:18 PM  Result Value Ref Range   Sodium 141 135 - 145 mmol/L   Potassium 4.0 3.5 - 5.1 mmol/L   Chloride 107 101 - 111 mmol/L   CO2 25 22 - 32 mmol/L   Glucose, Bld 107 (H) 65 - 99 mg/dL   BUN 13 6 - 20 mg/dL   Creatinine, Ser 0.73 0.44 - 1.00 mg/dL   Calcium 9.2 8.9 - 10.3 mg/dL   Total Protein 6.6 6.5 - 8.1 g/dL   Albumin 3.9 3.5 - 5.0 g/dL   AST 22 15 - 41 U/L   ALT 27 14 - 54 U/L   Alkaline Phosphatase 84 38 - 126 U/L   Total Bilirubin 0.9 0.3 - 1.2 mg/dL   GFR calc non Af Amer >60 >60 mL/min   GFR calc Af Amer >60 >60 mL/min    Comment: (NOTE) The eGFR has been calculated using the CKD EPI equation. This calculation has not been validated in all clinical situations. eGFR's persistently <60 mL/min signify possible Chronic Kidney Disease.    Anion gap 9 5 - 15  Wet prep, genital     Status: Abnormal   Collection Time: 04/24/15  1:17 PM  Result Value Ref Range   Yeast Wet Prep HPF POC NONE SEEN NONE SEEN   Trich, Wet Prep NONE SEEN NONE SEEN   Clue Cells Wet Prep HPF POC NONE SEEN NONE SEEN   WBC, Wet Prep HPF POC FEW (A) NONE SEEN    Comment: MODERATE BACTERIA SEEN   Sperm NONE SEEN   Pregnancy, urine POC     Status: None   Collection Time: 04/24/15  1:20 PM  Result Value Ref Range   Preg Test, Ur NEGATIVE NEGATIVE    Comment:        THE SENSITIVITY OF THIS METHODOLOGY IS >24 mIU/mL     Review of Systems  Constitutional:   Negative for fever and chills.  Cardiovascular: Negative  for chest pain and palpitations.  Gastrointestinal: Positive for nausea, vomiting, abdominal pain and diarrhea. Negative for constipation.  Genitourinary: Positive for flank pain. Negative for dysuria, urgency, frequency and hematuria.  Neurological: Negative for weakness.   Physical Exam   Blood pressure 134/80, pulse 60, temperature 98 F (36.7 C), temperature source Oral, resp. rate 18.  Physical Exam  Constitutional: She is oriented to person, place, and time. She appears well-developed and well-nourished. No distress.  GI: Soft. Normal appearance. She exhibits no distension. There is tenderness (LLQ). There is no rigidity, no rebound, no guarding and no CVA tenderness.  Genitourinary: No tenderness or bleeding in the vagina. Vaginal discharge (white discharge w/o odor) found.  Speculum exam: Vagina - Small amount of white discharge, no odor Cervix - No contact bleeding Bimanual exam: Cervix closed, no CMT  Uterus non tender, normal size Adnexa non tender, no masses bilaterally GC/Chlam, wet prep done Chaperone present for exam.  Musculoskeletal: Normal range of motion.  Neurological: She is alert and oriented to person, place, and time.  Skin: Skin is warm. She is not diaphoretic.    MAU Course  Procedures  None  MDM  Negative wbc, denies fever.  Toradol 60 mg IM Minimal pain relief from Toradol   This pain is chronic in nature; documentation shows patient has had this pain since 2013. This is not likely a GYN issue. ?sciatic pain. Diarrhea may be norovirus; although patient reports that no one else in her house is sick.   Assessment and Plan    A:  1. Chronic left lower quadrant pain   2. Norovirus    P:  Discharge home in stable condition Small, frequent meals Follow up with PCP; discussed the importance of a PCP for appropriate referral.  If symptoms worsen, go to  or [place].    Jennifer I Rasch, NP 04/24/2015 7:07 PM    PA student  Attestation   I have seen and examined this patient; I agree with above documentation in the PA's note.   Jill Vargas is a 49 y.o. [id number] reporting chronic left lower quadrant pain that radiates down her left leg. The pain is accompanied by diarrhea.  denies VB, vaginal discharge.  PE: BP 153/81 mmHg  Pulse 64  Temp(Src) 97.9 F (36.6 C) (Oral)  Resp 18 Gen: calm comfortable, NAD Resp: normal effort, no distress Abd: left lower quadrant tenderness, negative for rebound  + left straight leg test.  Plan: As stated above.   Jennifer I Rasch, NP 04/24/2015 7:15 PM     

## 2015-04-24 NOTE — Discharge Instructions (Signed)

## 2015-04-25 LAB — GC/CHLAMYDIA PROBE AMP (~~LOC~~) NOT AT ARMC
CHLAMYDIA, DNA PROBE: NEGATIVE
Neisseria Gonorrhea: NEGATIVE

## 2015-04-25 LAB — HIV ANTIBODY (ROUTINE TESTING W REFLEX): HIV Screen 4th Generation wRfx: NONREACTIVE

## 2015-07-03 ENCOUNTER — Emergency Department (HOSPITAL_COMMUNITY)
Admission: EM | Admit: 2015-07-03 | Discharge: 2015-07-03 | Disposition: A | Discharge status: Home / Self Care | Payer: 59 | Attending: Emergency Medicine | Admitting: Emergency Medicine

## 2015-07-03 ENCOUNTER — Emergency Department (HOSPITAL_COMMUNITY): Payer: 59

## 2015-07-03 ENCOUNTER — Encounter (HOSPITAL_COMMUNITY): Payer: Self-pay

## 2015-07-03 DIAGNOSIS — Z86018 Personal history of other benign neoplasm: Secondary | ICD-10-CM | POA: Diagnosis not present

## 2015-07-03 DIAGNOSIS — R200 Anesthesia of skin: Secondary | ICD-10-CM | POA: Diagnosis present

## 2015-07-03 DIAGNOSIS — F430 Acute stress reaction: Secondary | ICD-10-CM | POA: Diagnosis not present

## 2015-07-03 DIAGNOSIS — E785 Hyperlipidemia, unspecified: Secondary | ICD-10-CM | POA: Diagnosis not present

## 2015-07-03 DIAGNOSIS — R202 Paresthesia of skin: Secondary | ICD-10-CM

## 2015-07-03 DIAGNOSIS — Z8742 Personal history of other diseases of the female genital tract: Secondary | ICD-10-CM | POA: Insufficient documentation

## 2015-07-03 DIAGNOSIS — G8929 Other chronic pain: Secondary | ICD-10-CM | POA: Diagnosis not present

## 2015-07-03 DIAGNOSIS — Z87440 Personal history of urinary (tract) infections: Secondary | ICD-10-CM | POA: Insufficient documentation

## 2015-07-03 DIAGNOSIS — M6289 Other specified disorders of muscle: Secondary | ICD-10-CM

## 2015-07-03 DIAGNOSIS — Z87891 Personal history of nicotine dependence: Secondary | ICD-10-CM | POA: Insufficient documentation

## 2015-07-03 DIAGNOSIS — F449 Dissociative and conversion disorder, unspecified: Secondary | ICD-10-CM

## 2015-07-03 DIAGNOSIS — I639 Cerebral infarction, unspecified: Secondary | ICD-10-CM

## 2015-07-03 DIAGNOSIS — F444 Conversion disorder with motor symptom or deficit: Secondary | ICD-10-CM | POA: Diagnosis not present

## 2015-07-03 LAB — I-STAT CHEM 8, ED
BUN: 11 mg/dL (ref 6–20)
CREATININE: 0.7 mg/dL (ref 0.44–1.00)
Calcium, Ion: 1.11 mmol/L — ABNORMAL LOW (ref 1.12–1.23)
Chloride: 106 mmol/L (ref 101–111)
GLUCOSE: 133 mg/dL — AB (ref 65–99)
HEMATOCRIT: 44 % (ref 36.0–46.0)
HEMOGLOBIN: 15 g/dL (ref 12.0–15.0)
Potassium: 3.7 mmol/L (ref 3.5–5.1)
Sodium: 141 mmol/L (ref 135–145)
TCO2: 22 mmol/L (ref 0–100)

## 2015-07-03 LAB — COMPREHENSIVE METABOLIC PANEL
ALT: 42 U/L (ref 14–54)
ANION GAP: 12 (ref 5–15)
AST: 28 U/L (ref 15–41)
Albumin: 3.8 g/dL (ref 3.5–5.0)
Alkaline Phosphatase: 79 U/L (ref 38–126)
BUN: 10 mg/dL (ref 6–20)
CHLORIDE: 108 mmol/L (ref 101–111)
CO2: 21 mmol/L — ABNORMAL LOW (ref 22–32)
Calcium: 9.2 mg/dL (ref 8.9–10.3)
Creatinine, Ser: 0.82 mg/dL (ref 0.44–1.00)
Glucose, Bld: 136 mg/dL — ABNORMAL HIGH (ref 65–99)
Potassium: 3.7 mmol/L (ref 3.5–5.1)
SODIUM: 141 mmol/L (ref 135–145)
Total Bilirubin: 0.6 mg/dL (ref 0.3–1.2)
Total Protein: 6.5 g/dL (ref 6.5–8.1)

## 2015-07-03 LAB — CBC
HEMATOCRIT: 42.5 % (ref 36.0–46.0)
Hemoglobin: 14.1 g/dL (ref 12.0–15.0)
MCH: 28.5 pg (ref 26.0–34.0)
MCHC: 33.2 g/dL (ref 30.0–36.0)
MCV: 86 fL (ref 78.0–100.0)
PLATELETS: 221 10*3/uL (ref 150–400)
RBC: 4.94 MIL/uL (ref 3.87–5.11)
RDW: 12.8 % (ref 11.5–15.5)
WBC: 6.5 10*3/uL (ref 4.0–10.5)

## 2015-07-03 LAB — DIFFERENTIAL
BASOS PCT: 0 %
Basophils Absolute: 0 10*3/uL (ref 0.0–0.1)
EOS ABS: 0.2 10*3/uL (ref 0.0–0.7)
EOS PCT: 3 %
Lymphocytes Relative: 29 %
Lymphs Abs: 1.9 10*3/uL (ref 0.7–4.0)
MONO ABS: 0.5 10*3/uL (ref 0.1–1.0)
MONOS PCT: 7 %
NEUTROS ABS: 3.9 10*3/uL (ref 1.7–7.7)
Neutrophils Relative %: 61 %

## 2015-07-03 LAB — PROTIME-INR
INR: 1.21 (ref 0.00–1.49)
PROTHROMBIN TIME: 15.5 s — AB (ref 11.6–15.2)

## 2015-07-03 LAB — CBG MONITORING, ED: GLUCOSE-CAPILLARY: 127 mg/dL — AB (ref 65–99)

## 2015-07-03 LAB — I-STAT TROPONIN, ED: TROPONIN I, POC: 0 ng/mL (ref 0.00–0.08)

## 2015-07-03 LAB — APTT: aPTT: 29 seconds (ref 24–37)

## 2015-07-03 NOTE — ED Notes (Signed)
Pt transported to MRI with this RN. 

## 2015-07-03 NOTE — Consult Note (Signed)
Neurology Consultation Reason for Consult: Right sided weakness Referring Physician: Laneta Simmers, D  CC: Right-sided weakness  History is obtained from: Patient  HPI: Jill Vargas is a 50 y.o. female who was in her normal state of health until 1:30 PM. At that time she had tingling on her right side. She sought care had an urgent care and EMS was called for further evaluation. On arrival, she had some right-sided weakness that had a give way component as well as headache with photophobia. The headache was bifrontal in location.  She does have a history of migraines but has not had an event like this before. She endorses that she is under a lot of stress, stating that it is "just too much, just too much."   LKW: 1:30 PM tpa given?: no, not a stroke    ROS: A 14 point ROS was performed and is negative except as noted in the HPI.   Past Medical History  Diagnosis Date  . Fibroid uterus   . Hyperlipemia     diet controlled - no meds  . Constipation     history  . Menorrhagia   . Hydrosalpinx 10/2013    left  . Chronic pelvic pain in female   . Chronic back pain     history related to cyst/fibroids  . Ovarian cyst 11/2013    left  . SVD (spontaneous vaginal delivery)     x 3  . Ectopic pregnancy     surgical removal  . Infection     UTI     Family History  Problem Relation Age of Onset  . Hypertension Mother   . Diabetes Father      Social History:  reports that she quit smoking about 14 years ago. Her smoking use included Cigarettes. She has a 2 pack-year smoking history. She has never used smokeless tobacco. She reports that she does not drink alcohol or use illicit drugs.   Exam: Current vital signs: Filed Vitals:   07/03/15 1830 07/03/15 1833  BP: 171/86   Pulse: 79   Temp:  98 F (36.7 C)  Resp: 18    Vital signs in last 24 hours:     Physical Exam  Constitutional: Appears well-developed and well-nourished.  Psych: Appears stressed out Eyes: No  scleral injection HENT: No OP obstrucion Head: Normocephalic.  Cardiovascular: Normal rate and regular rhythm.  Respiratory: Effort normal and breath sounds normal to anterior ascultation GI: Soft.  No distension. There is no tenderness.  Skin: WDI  Neuro: Mental Status: Patient is awake, alert, oriented to person, place, month, year, and situation. Patient is able to give a clear and coherent history. No signs of aphasia or neglect Patient has a waxing and waning exam over the course of my encounter, and sometimes she is unable to tell me her name, responding that her name is "[name]." She also states that she does not know the year, gives the month is July. She then has a period of decreased responsiveness, however when her right arm which is flaccid at all other times is held in front of her face she does deflected and allow it to flop next to her. Cranial Nerves: II: Visual Fields are full. Pupils are equal, round, and reactive to light.   III,IV, VI: EOMI without ptosis or diploplia.  V: Facial sensation is symmetric to temperature VII: Facial movement is symmetric.  VIII: hearing is intact to voice X: Uvula elevates symmetrically XI: Shoulder shrug is symmetric. XII: tongue is  midline without atrophy or fasciculations.  Motor: Her right sided strength exam is markedly inconsistent, and when positioned in front of her face, she did flex her arm when she is unable to move at other times. Sensory: She endorses decreased sensation throughout the right side, splitting midline on the forehead Cerebellar: Will not perform on the right, intact on the left   I have reviewed labs in epic and the results pertinent to this consultation are: CMP-unremarkable  I have reviewed the images obtained: CT head-negative  Impression: 50 year old female with sudden onset right-sided tingling progressing to confusion. She has multiple findings on exam concerning for non-organic etiology to these  findings.  Given her headache, it is difficult to rule out complex migraine. I would favor getting an MRI to rule out ischemic changes and if negative, then would treat his complex migraine.  Recommendations: 1) MRI brain 2) if negative, could consider migraine cocktail.  3) if negative, no further neurological workup is needed at this time and neurology will sign off.   Roland Rack, MD [facility] [phone]  If 7pm- 7am, please page neurology on call as listed in [place].

## 2015-07-03 NOTE — ED Notes (Signed)
MD at bedside. 

## 2015-07-03 NOTE — ED Notes (Signed)
Cancelled Code Stroke @ [id number] per Dr. Leonel Ramsay

## 2015-07-03 NOTE — Discharge Instructions (Signed)
Stress and Stress Management °Stress is a normal reaction to life events. It is what you feel when life demands more than you are used to or more than you can handle. Some stress can be useful. For example, the stress reaction can help you catch the last bus of the day, study for a test, or meet a deadline at work. But stress that occurs too often or for too long can cause problems. It can affect your emotional health and interfere with relationships and normal daily activities. Too much stress can weaken your immune system and increase your risk for physical illness. If you already have a medical problem, stress can make it worse. °CAUSES  °All sorts of life events may cause stress. An event that causes stress for one person may not be stressful for another person. Major life events commonly cause stress. These may be positive or negative. Examples include losing your job, moving into a new home, getting married, having a baby, or losing a loved one. Less obvious life events may also cause stress, especially if they occur day after day or in combination. Examples include working long hours, driving in traffic, caring for children, being in debt, or being in a difficult relationship. °SIGNS AND SYMPTOMS °Stress may cause emotional symptoms including, the following: °· Anxiety. This is feeling worried, afraid, on edge, overwhelmed, or out of control. °· Anger. This is feeling irritated or impatient. °· Depression. This is feeling sad, down, helpless, or guilty. °· Difficulty focusing, remembering, or making decisions. °Stress may cause physical symptoms, including the following:  °· Aches and pains. These may affect your head, neck, back, stomach, or other areas of your body. °· Tight muscles or clenched jaw. °· Low energy or trouble sleeping.  °Stress may cause unhealthy behaviors, including the following:  °· Eating to feel better (overeating) or skipping meals. °· Sleeping too little, too much, or both. °· Working  too much or putting off tasks (procrastination). °· Smoking, drinking alcohol, or using drugs to feel better. °DIAGNOSIS  °Stress is diagnosed through an assessment by your health care provider. Your health care provider will ask questions about your symptoms and any stressful life events. Your health care provider will also ask about your medical history and may order blood tests or other tests. Certain medical conditions and medicine can cause physical symptoms similar to stress.  Mental illness can cause emotional symptoms and unhealthy behaviors similar to stress. Your health care provider may refer you to a mental health professional for further evaluation.  °TREATMENT  °Stress management is the recommended treatment for stress. The goals of stress management are reducing stressful life events and coping with stress in healthy ways.  °Techniques for reducing stressful life events include the following: °· Stress identification. Self-monitor for stress and identify what causes stress for you. These skills may help you to avoid some stressful events. °· Time management. Set your priorities, keep a calendar of events, and learn to say "no." These tools can help you avoid making too many commitments. °Techniques for coping with stress include the following: °· Rethinking the problem. Try to think realistically about stressful events rather than ignoring them or overreacting. Try to find the positives in a stressful situation rather than focusing on the negatives. °· Exercise. Physical exercise can release both physical and emotional tension. The key is to find a form of exercise you enjoy and do it regularly. °· Relaxation techniques. These relax the body and mind. Examples include yoga, meditation, tai chi, biofeedback, deep   breathing, progressive muscle relaxation, listening to music, being out in nature, journaling, and other hobbies. Again, the key is to find one or more that you enjoy and can do  regularly.  Healthy lifestyle. Eat a balanced diet, get plenty of sleep, and do not smoke. Avoid using alcohol or drugs to relax.  Strong support network. Spend time with family, friends, or other people you enjoy being around.Express your feelings and talk things over with someone you trust. Counseling or talktherapy with a mental health professional may be helpful if you are having difficulty managing stress on your own. Medicine is typically not recommended for the treatment of stress.Talk to your health care provider if you think you need medicine for symptoms of stress. HOME CARE INSTRUCTIONS  Keep all follow-up visits as directed by your health care provider.  Take all medicines as directed by your health care provider. SEEK MEDICAL CARE IF:  Your symptoms get worse or you start having new symptoms.  You feel overwhelmed by your problems and can no longer manage them on your own. SEEK IMMEDIATE MEDICAL CARE IF:  You feel like hurting yourself or someone else.   This information is not intended to replace advice given to you by your health care provider. Make sure you discuss any questions you have with your health care provider.   Document Released: 09/04/2000 Document Revised: 04/01/2014 Document Reviewed: 11/03/2012 Elsevier Interactive Patient Education [employer].

## 2015-07-03 NOTE — ED Notes (Signed)
Pt noted to have increased movement of right arm. Pt able to sign e-signature prior to discharge and transfer from bed to wheelchair without assistance.

## 2015-07-03 NOTE — ED Notes (Signed)
MD aware that pt has failed her swallow screen. Pt okay to be d/c due to symptoms will most likely continue to resolve.

## 2015-07-03 NOTE — ED Provider Notes (Signed)
CSN: [id number]     Arrival date & time 07/03/15  1641 History   First MD Initiated Contact with Patient 07/03/15 1702     Chief Complaint  Patient presents with  . Code Stroke    An emergency department physician performed an initial assessment on this suspected stroke patient at 27. (Consider location/radiation/quality/duration/timing/severity/associated sxs/prior Treatment) Patient is a 50 y.o. female presenting with anxiety. The history is provided by the patient and a relative.  Anxiety This is a new problem. Episode onset: 130 today driving and felt dizzy, just finished arguing ith children's father and was very upset. The problem occurs constantly. The problem has been rapidly improving. Associated symptoms comments: Numbness in right hand, then drove to work, went to PCP and was sent for stroke r/o. Marland Kitchen Nothing aggravates the symptoms. Nothing relieves the symptoms. She has tried rest for the symptoms. The treatment provided significant relief.    Past Medical History  Diagnosis Date  . Fibroid uterus   . Hyperlipemia     diet controlled - no meds  . Constipation     history  . Menorrhagia   . Hydrosalpinx 10/2013    left  . Chronic pelvic pain in female   . Chronic back pain     history related to cyst/fibroids  . Ovarian cyst 11/2013    left  . SVD (spontaneous vaginal delivery)     x 3  . Ectopic pregnancy     surgical removal  . Infection     UTI   Past Surgical History  Procedure Laterality Date  . Salpingectomy  1998  . Appendectomy    . Dilitation & currettage/hystroscopy with novasure ablation  04/06/2012    Procedure: DILATATION & CURETTAGE/HYSTEROSCOPY WITH NOVASURE ABLATION;  Surgeon: Lavonia Drafts, MD;  Location: [facility];  Service: Gynecology;  Laterality: N/A;  . Ablation    . Wisdom tooth extraction    . Laparoscopic bilateral salpingectomy Left 08/18/2014    Procedure: LAPAROSCOPIC LEFT  SALPINGECTOMY;  Surgeon: Woodroe Mode, MD;  Location:  [facility];  Service: Gynecology;  Laterality: Left;   Family History  Problem Relation Age of Onset  . Hypertension Mother   . Diabetes Father    Social History  Substance Use Topics  . Smoking status: Former Smoker -- 0.25 packs/day for 8 years    Types: Cigarettes    Quit date: 01/23/2001  . Smokeless tobacco: Never Used  . Alcohol Use: No   OB History    Gravida Para Term Preterm AB TAB SAB Ectopic Multiple Living   4 3 1 2 1  0 0 1 0 3     Review of Systems  All other systems reviewed and are negative.     Allergies  Review of patient's allergies indicates no known allergies.  Home Medications   Prior to Admission medications   Medication Sig Start Date End Date Taking? Authorizing Provider  ibuprofen (ADVIL,MOTRIN) 800 MG tablet Take 1 tablet (800 mg total) by mouth 3 (three) times daily. 04/24/15   Artist Pais Rasch, NP   BP 153/87 mmHg  Pulse 71  Temp(Src) 98 F (36.7 C) (Oral)  Resp 17  Wt 181 lb (82.1 kg)  SpO2 99% Physical Exam  Constitutional: She is oriented to person, place, and time. She appears well-developed and well-nourished. No distress.  HENT:  Head: Normocephalic.  Eyes: Conjunctivae are normal.  Neck: Neck supple. No tracheal deviation present.  Cardiovascular: Normal rate and regular rhythm.   Pulmonary/Chest: Effort normal.  No respiratory distress.  Abdominal: Soft. She exhibits no distension.  Neurological: She is alert and oriented to person, place, and time. She has normal strength. No cranial nerve deficit or sensory deficit. Coordination normal. GCS eye subscore is 4. GCS verbal subscore is 5. GCS motor subscore is 6.  Skin: Skin is warm and dry.  Psychiatric: She has a normal mood and affect.    ED Course  Procedures (including critical care time) Labs Review Labs Reviewed  PROTIME-INR - Abnormal; Notable for the following:    Prothrombin Time 15.5 (*)    All other components within normal limits  COMPREHENSIVE METABOLIC PANEL -  Abnormal; Notable for the following:    CO2 21 (*)    Glucose, Bld 136 (*)    All other components within normal limits  I-STAT CHEM 8, ED - Abnormal; Notable for the following:    Glucose, Bld 133 (*)    Calcium, Ion 1.11 (*)    All other components within normal limits  APTT  CBC  DIFFERENTIAL  I-STAT TROPOININ, ED  CBG MONITORING, ED    Imaging Review Ct Head Wo Contrast  07/03/2015  CLINICAL DATA:  50 year old female code stroke. Right side weakness and numbness since 1330 hours. Initial encounter. EXAM: CT HEAD WITHOUT CONTRAST TECHNIQUE: Contiguous axial images were obtained from the base of the skull through the vertex without intravenous contrast. COMPARISON:  [facility] head CT without contrast 03/02/2013 FINDINGS: Mild motion artifact at the skullbase. Visualized paranasal sinuses and mastoids are clear. No acute osseous abnormality identified. Negative visualized orbit and scalp soft tissues. Normal cerebral volume. No midline shift, ventriculomegaly, mass effect, evidence of mass lesion, intracranial hemorrhage or evidence of cortically based acute infarction. Gray-white matter differentiation is within normal limits throughout the brain. Stable appearance of major intracranial vasculature compared to 2014. IMPRESSION: Stable and normal noncontrast CT appearance of the brain, discussed by telephone with Dr. Roland Rack on 07/03/2015 at 16:57 . Electronically Signed   By: Genevie Ann M.D.   On: 07/03/2015 16:58   Mr Brain Wo Contrast  07/03/2015  CLINICAL DATA:  Right-sided weakness. Suspected cerebral infarction. EXAM: MRI HEAD WITHOUT CONTRAST TECHNIQUE: Multiplanar, multiecho pulse sequences of the brain and surrounding structures were obtained without intravenous contrast. COMPARISON:  CT head earlier today. FINDINGS: No evidence for acute infarction, hemorrhage, mass lesion, hydrocephalus, or extra-axial fluid. Normal cerebral volume. No white matter disease.  Normal midline structures. Flow voids are maintained. Extracranial soft tissues are unremarkable. IMPRESSION: Negative exam.  No acute stroke is evident. Electronically Signed   By: Staci Righter M.D.   On: 07/03/2015 18:16   I have personally reviewed and evaluated these images and lab results as part of my medical decision-making.   EKG Interpretation   Date/Time:  Monday July 03 2015 17:58:36 EDT Ventricular Rate:  72 PR Interval:  167 QRS Duration: 95 QT Interval:  416 QTC Calculation: 455 R Axis:   53 Text Interpretation:  Sinus rhythm Borderline T abnormalities, anterior  leads Otherwise normal ECG No previous tracing Confirmed by Jenisa Monty MD,  Quillian Quince [id number]) on 07/03/2015 6:07:51 PM      MDM   Final diagnoses:  Conversion reaction  Stress response  Paresthesia   50 y.o. female presents with stressful episode today where she fought with her children's father. When she drove to work she noticed tingling in her hand and felt she could not use it properly. Neurology saw on arrival as code stroke. No significant hematologic  or metabolic abnormalities to explain symptoms. MR negative. Symptoms appear non-neurologic. Discussed likelihood of conversion response given context. Plan to follow up with PCP as needed and return precautions discussed for worsening or new concerning symptoms.     Leo Grosser, MD 07/04/15 [phone]

## 2015-07-03 NOTE — ED Notes (Signed)
[facility]- pt here as a code stroke with c/o right side numbness, weakness, and headache. Pt alert and oriented with EMS, vitals stable. Pt reports headache and dizziness on arrival.

## 2015-11-09 IMAGING — US US TRANSVAGINAL NON-OB
1 series · 14 of 25 positions shown · non-contrast
Comparison: None.

CLINICAL DATA: Probable hydrosalpinx.

EXAM:
TRANSABDOMINAL ULTRASOUND OF PELVIS
TECHNIQUE: Transabdominal ultrasound examination of the pelvis was performed
including evaluation of the uterus, ovaries, adnexal regions, and
pelvic cul-de-sac.

[Series 1: us transvaginal non-ob · 14 of 36 slices shown]
[im 1/36]
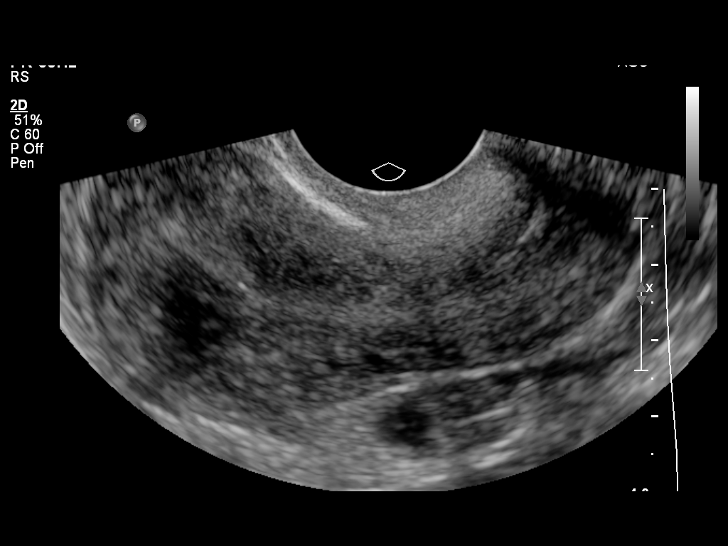
[im 3/36]
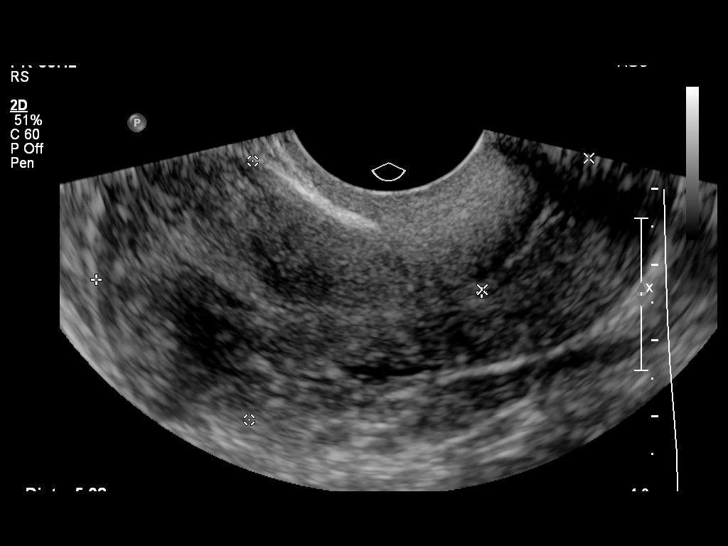
[im 6/36]
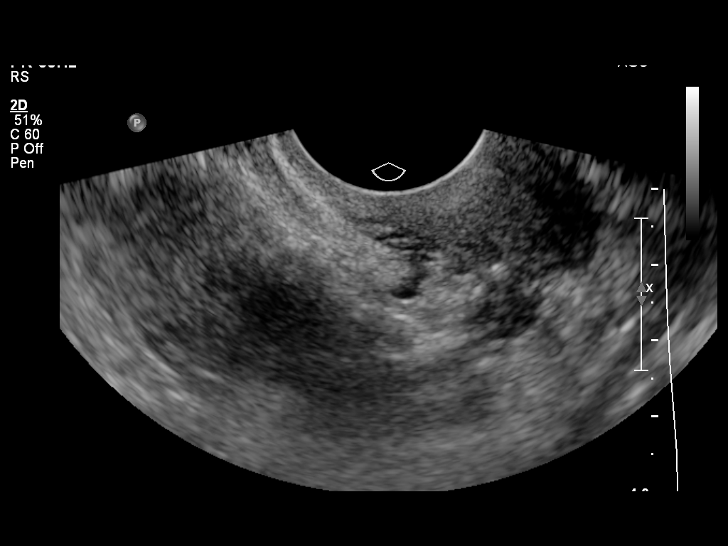
[im 9/36]
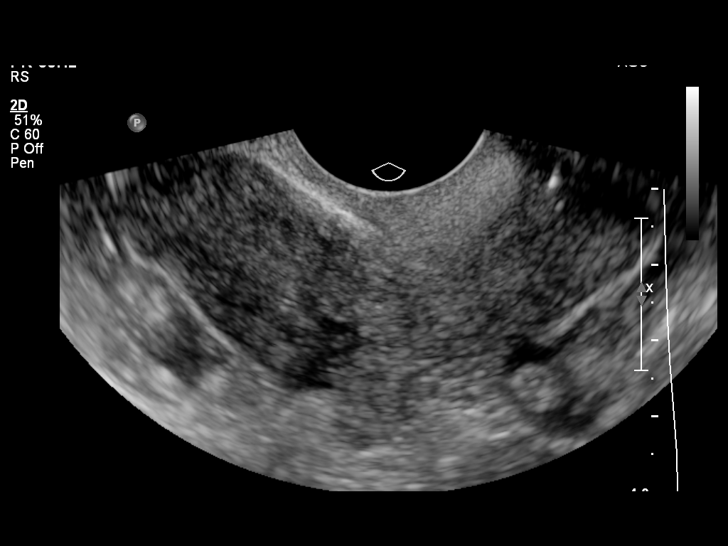
[im 12/36]
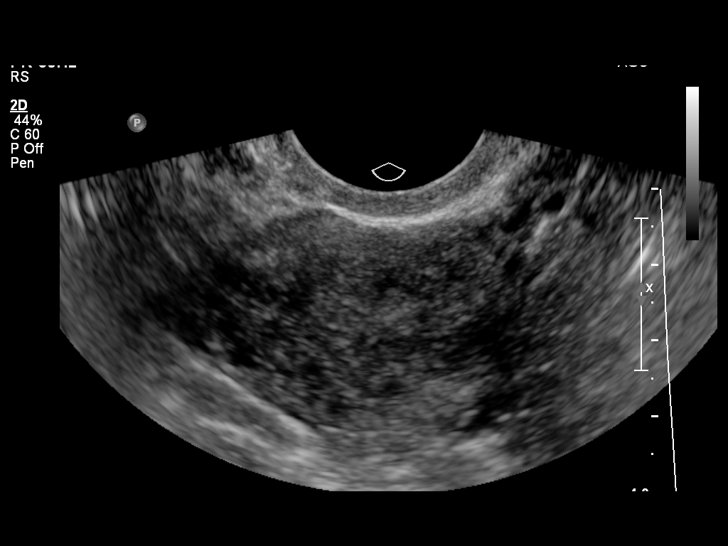
[im 14/36]
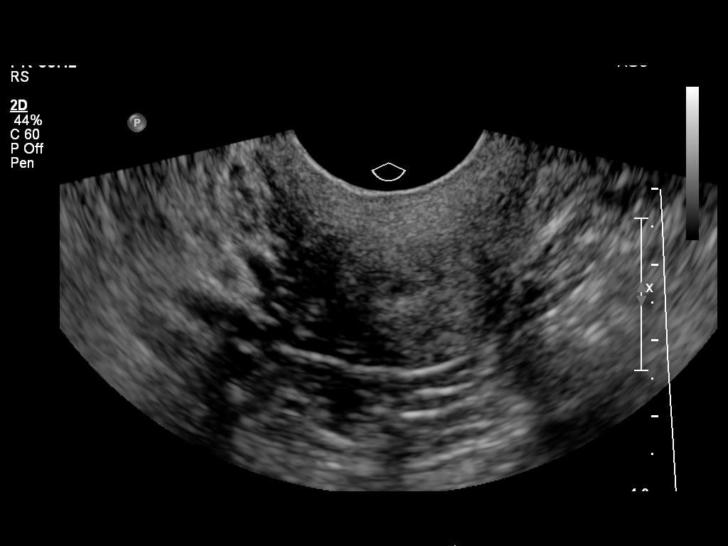
[im 17/36]
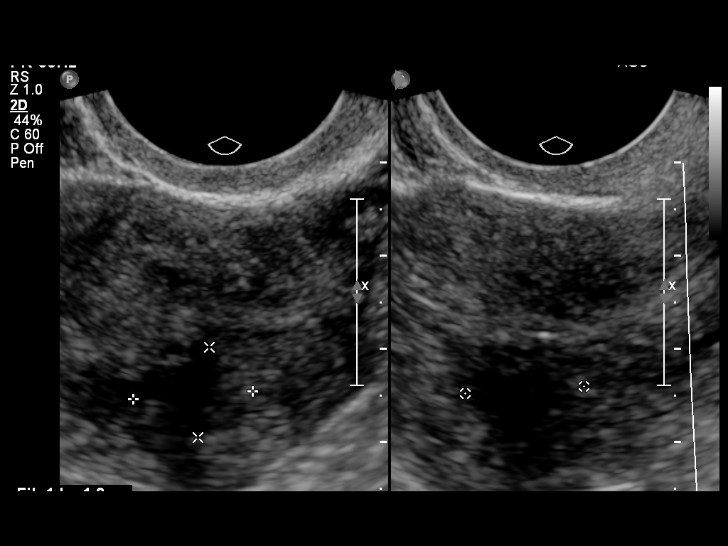
[im 19/36]
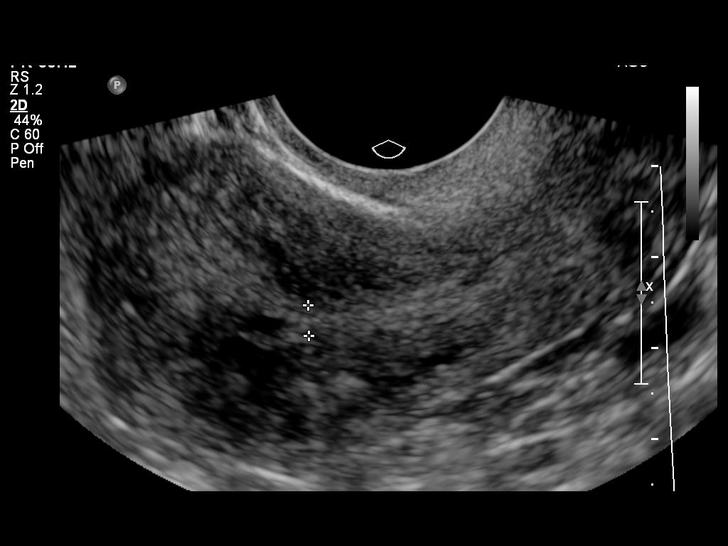
[im 22/36]
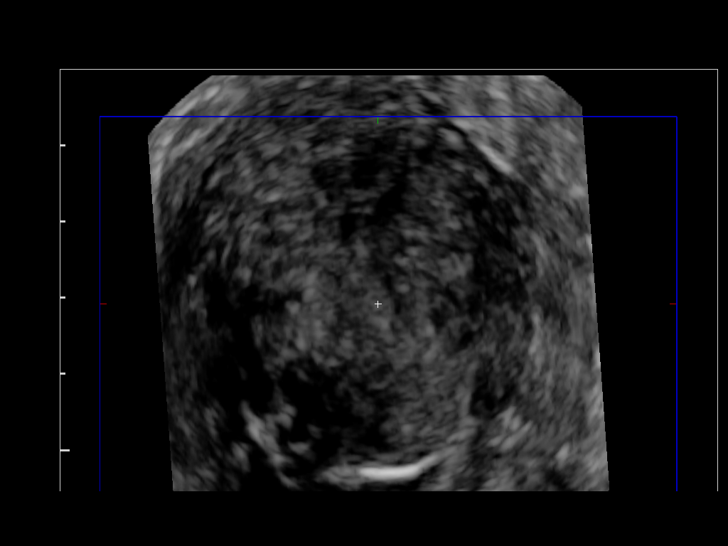
[im 24/36]
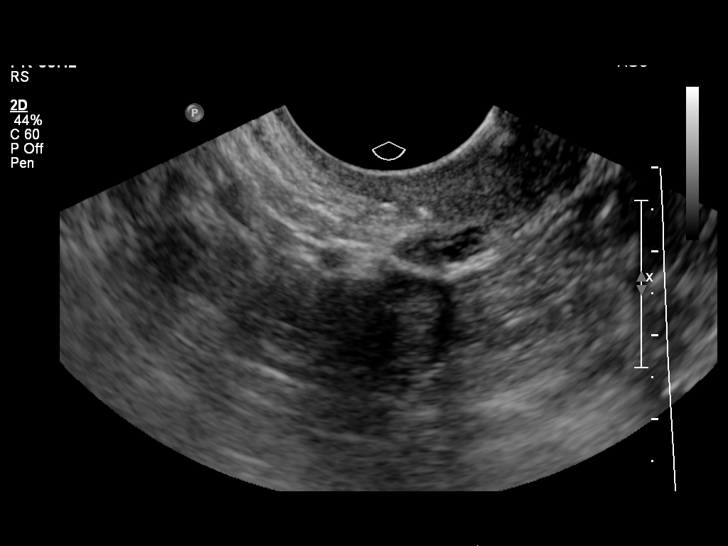
[im 27/36]
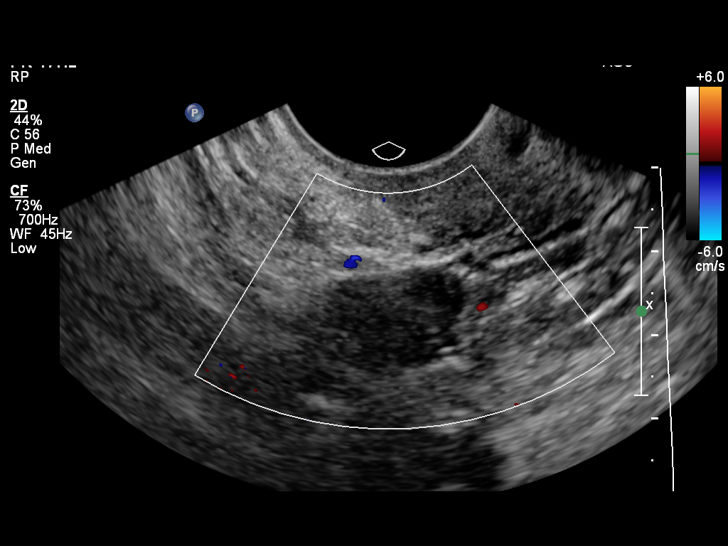
[im 30/36]
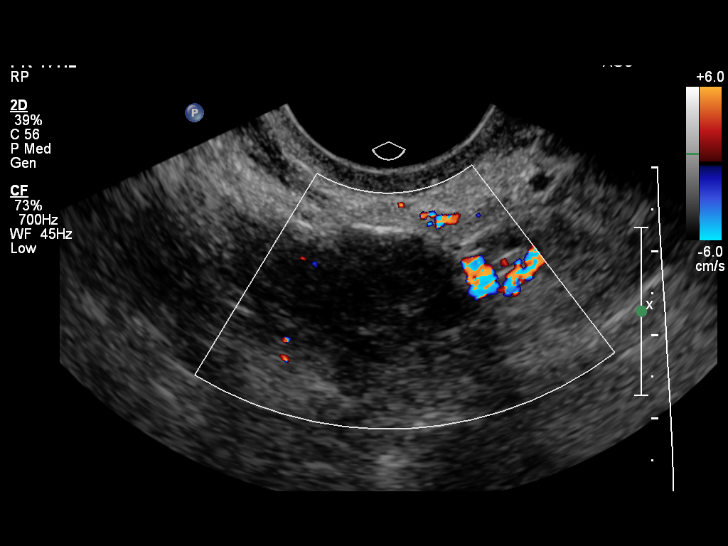
[im 33/36]
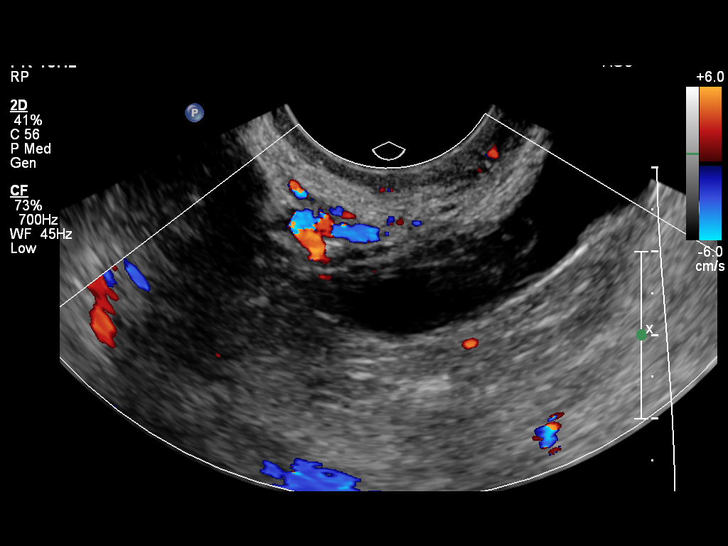
[im 36/36]
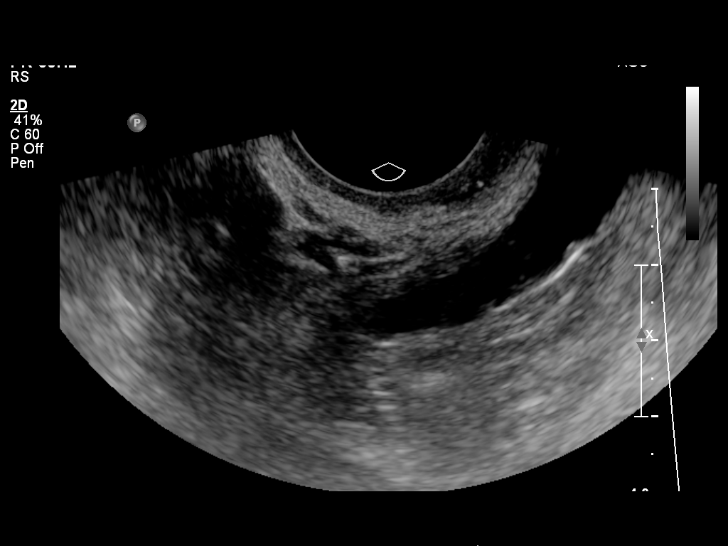

[14 of 25 positions shown; findings below may reference images not displayed]

FINDINGS: Uterus

Measurements: 7.3 x 3.4 x 4.5 cm. No fibroids or other mass
visualized.

Endometrium

Thickness: 3 mm.  No focal abnormality visualized.

Right ovary

Measurements: 1.7 x 1.1 x 2.0 cm. Normal appearance/no adnexal mass.

Left ovary

Measurements: 1.9 x 1.6 x 2.0 cm. Within the left adnexa there is a
fluid-filled tubular structure measuring up to 5 cm, most compatible
with hydrosalpinx.

Other findings:  No free fluid
IMPRESSION: Findings post compatible with left hydrosalpinx.

## 2015-11-20 ENCOUNTER — Emergency Department (HOSPITAL_COMMUNITY): Payer: 59

## 2015-11-20 ENCOUNTER — Encounter (HOSPITAL_COMMUNITY): Payer: Self-pay

## 2015-11-20 ENCOUNTER — Emergency Department (HOSPITAL_COMMUNITY)
Admission: EM | Admit: 2015-11-20 | Discharge: 2015-11-20 | Disposition: A | Discharge status: Home / Self Care | Payer: 59 | Attending: Emergency Medicine | Admitting: Emergency Medicine

## 2015-11-20 DIAGNOSIS — R109 Unspecified abdominal pain: Secondary | ICD-10-CM

## 2015-11-20 DIAGNOSIS — R197 Diarrhea, unspecified: Secondary | ICD-10-CM | POA: Insufficient documentation

## 2015-11-20 DIAGNOSIS — Z87891 Personal history of nicotine dependence: Secondary | ICD-10-CM | POA: Insufficient documentation

## 2015-11-20 DIAGNOSIS — R110 Nausea: Secondary | ICD-10-CM | POA: Insufficient documentation

## 2015-11-20 LAB — COMPREHENSIVE METABOLIC PANEL
ALT: 32 U/L (ref 14–54)
AST: 25 U/L (ref 15–41)
Albumin: 4.2 g/dL (ref 3.5–5.0)
Alkaline Phosphatase: 82 U/L (ref 38–126)
Anion gap: 6 (ref 5–15)
BUN: 12 mg/dL (ref 6–20)
CO2: 25 mmol/L (ref 22–32)
Calcium: 9.3 mg/dL (ref 8.9–10.3)
Chloride: 109 mmol/L (ref 101–111)
Creatinine, Ser: 0.71 mg/dL (ref 0.44–1.00)
GFR calc Af Amer: >60 mL/min (ref >60)
GFR calc non Af Amer: >60 mL/min (ref >60)
Glucose, Bld: 112 mg/dL — ABNORMAL HIGH (ref 65–99)
Potassium: 3.8 mmol/L (ref 3.5–5.1)
Sodium: 140 mmol/L (ref 135–145)
Total Bilirubin: 0.8 mg/dL (ref 0.3–1.2)
Total Protein: 7.1 g/dL (ref 6.5–8.1)

## 2015-11-20 LAB — URINALYSIS, ROUTINE W REFLEX MICROSCOPIC
Bilirubin Urine: NEGATIVE
Glucose, UA: NEGATIVE mg/dL
Hgb urine dipstick: NEGATIVE
Ketones, ur: NEGATIVE mg/dL
Leukocytes, UA: NEGATIVE
Nitrite: NEGATIVE
Protein, ur: NEGATIVE mg/dL
Specific Gravity, Urine: 1.028 (ref 1.005–1.030)
pH: 5.5 (ref 5.0–8.0)

## 2015-11-20 LAB — I-STAT BETA HCG BLOOD, ED (MC, WL, AP ONLY): I-stat hCG, quantitative: 6.7 m[IU]/mL — ABNORMAL HIGH (ref <5)

## 2015-11-20 LAB — CBC
HCT: 44.5 % (ref 36.0–46.0)
Hemoglobin: 14.7 g/dL (ref 12.0–15.0)
MCH: 28.9 pg (ref 26.0–34.0)
MCHC: 33 g/dL (ref 30.0–36.0)
MCV: 87.6 fL (ref 78.0–100.0)
Platelets: 244 10*3/uL (ref 150–400)
RBC: 5.08 MIL/uL (ref 3.87–5.11)
RDW: 13 % (ref 11.5–15.5)
WBC: 6.3 10*3/uL (ref 4.0–10.5)

## 2015-11-20 LAB — LIPASE, BLOOD: Lipase: 29 U/L (ref 11–51)

## 2015-11-20 LAB — PREGNANCY, URINE: Preg Test, Ur: NEGATIVE

## 2015-11-20 MED ORDER — HYDROCODONE-ACETAMINOPHEN 5-325 MG PO TABS
1.0000 | ORAL_TABLET | Freq: Four times a day (QID) | ORAL | 0 refills | Status: DC | PRN
Start: 2015-11-20 — End: 2017-09-07

## 2015-11-20 MED ORDER — SODIUM CHLORIDE 0.9 % IV BOLUS (SEPSIS)
1000.0000 mL | Freq: Once | INTRAVENOUS | Status: AC
  Administered 2015-11-20: 1000 mL via INTRAVENOUS

## 2015-11-20 MED ORDER — MORPHINE SULFATE (PF) 4 MG/ML IV SOLN
4.0000 mg | Freq: Once | INTRAVENOUS | Status: AC
  Administered 2015-11-20: 4 mg via INTRAVENOUS
  Filled 2015-11-20: qty 1

## 2015-11-20 MED ORDER — METOCLOPRAMIDE HCL 10 MG PO TABS: 10.0000 mg | ORAL_TABLET | Freq: Four times a day (QID) | ORAL | 0 refills | Status: DC | PRN

## 2015-11-20 MED ORDER — IOPAMIDOL (ISOVUE-300) INJECTION 61%
100.0000 mL | Freq: Once | INTRAVENOUS | Status: AC | PRN
  Administered 2015-11-20: 100 mL via INTRAVENOUS

## 2015-11-20 MED ORDER — METOCLOPRAMIDE HCL 5 MG/ML IJ SOLN
10.0000 mg | Freq: Once | INTRAMUSCULAR | Status: AC
  Administered 2015-11-20: 10 mg via INTRAVENOUS
  Filled 2015-11-20: qty 2

## 2015-11-20 NOTE — ED Triage Notes (Signed)
Pt c/o L flank pain radiating into LLQ, nausea, and diarrhea x 8 days.  Pain score 9/10.  Pt reports urine is discolored and sts "I keeping wanting to go poo poo."  Sts diarrhea has resolved.

## 2015-11-20 NOTE — ED Notes (Signed)
Pt reports nausea is gone.

## 2015-11-20 NOTE — ED Provider Notes (Signed)
[facility] DEPT Provider Note   CSN: [id number] Arrival date & time: 11/20/15  0944     History   Chief Complaint Chief Complaint  Patient presents with  . Flank Pain  . Abdominal Pain  . Emesis    HPI Jill Vargas is a 50 y.o. female.  HPI Complains of right flank pain rating to right lower quadrant onset 8 days ago. Symptoms accompanied by multiple episodes of nonbloody nonbilious diarrhea and nausea. No vomiting. Last episode of diarrhea was yesterday. She not had a bowel movement today. Pain worse with movement and improved with remaining still. She denies fever. No treatment prior to coming here. No other associated symptoms. No dysuria. No recent travel to exotic places. No recent antibiotic use Past Medical History:  Diagnosis Date  . Chronic back pain    history related to cyst/fibroids  . Chronic pelvic pain in female   . Constipation    history  . Ectopic pregnancy    surgical removal  . Fibroid uterus   . Hydrosalpinx 10/2013   left  . Hyperlipemia    diet controlled - no meds  . Infection    UTI  . Menorrhagia   . Ovarian cyst 11/2013   left  . SVD (spontaneous vaginal delivery)    x 3    Patient Active Problem List   Diagnosis Date Noted  . Hydrosalpinx 05/23/2014  . Abnormal thyroid stimulating hormone (TSH) level 04/29/2014  . HLD (hyperlipidemia) 04/29/2014  . HA (headache)     Past Surgical History:  Procedure Laterality Date  . ABLATION    . APPENDECTOMY    . DILITATION & CURRETTAGE/HYSTROSCOPY WITH NOVASURE ABLATION  04/06/2012   Procedure: DILATATION & CURETTAGE/HYSTEROSCOPY WITH NOVASURE ABLATION;  Surgeon: Lavonia Drafts, MD;  Location: [facility];  Service: Gynecology;  Laterality: N/A;  . LAPAROSCOPIC BILATERAL SALPINGECTOMY Left 08/18/2014   Procedure: LAPAROSCOPIC LEFT  SALPINGECTOMY;  Surgeon: Woodroe Mode, MD;  Location: [facility];  Service: Gynecology;  Laterality: Left;  . SALPINGECTOMY  1998  . WISDOM TOOTH EXTRACTION       OB History    Gravida Para Term Preterm AB Living   4 3 1 2 1 3    SAB TAB Ectopic Multiple Live Births   0 0 1 0         Home Medications    Prior to Admission medications   Medication Sig Start Date End Date Taking? Authorizing Provider  ibuprofen (ADVIL,MOTRIN) 800 MG tablet Take 1 tablet (800 mg total) by mouth 3 (three) times daily. 04/24/15   Lezlie Lye, NP    Family History Family History  Problem Relation Age of Onset  . Hypertension Mother   . Diabetes Father     Social History Social History  Substance Use Topics  . Smoking status: Former Smoker    Packs/day: 0.25    Years: 8.00    Types: Cigarettes    Quit date: 01/23/2001  . Smokeless tobacco: Never Used  . Alcohol use No     Allergies   Review of patient's allergies indicates no known allergies.   Review of Systems Review of Systems  Constitutional: Positive for appetite change. Negative for unexpected weight change.  HENT: Negative.   Respiratory: Negative.   Cardiovascular: Negative.   Gastrointestinal: Positive for abdominal pain, diarrhea and nausea.  Genitourinary: Positive for flank pain.       Amenorrheic since ablation  Skin: Negative.   Neurological: Negative.   Psychiatric/Behavioral: Negative.   All  other systems reviewed and are negative.    Physical Exam Updated Vital Signs BP 114/79 (BP Location: Left Arm)   Pulse 60   Temp 97.7 F (36.5 C) (Oral)   Resp 16   Ht 5\' 2"  (1.575 m)   Wt 160 lb (72.6 kg)   SpO2 98%   BMI 29.26 kg/m   Physical Exam  Constitutional: She appears well-developed and well-nourished. No distress.  HENT:  Head: Normocephalic and atraumatic.  Eyes: Conjunctivae are normal. Pupils are equal, round, and reactive to light.  Neck: Neck supple. No tracheal deviation present. No thyromegaly present.  Cardiovascular: Normal rate and regular rhythm.   No murmur heard. Pulmonary/Chest: Effort normal and breath sounds normal.  Abdominal: Soft.  Bowel sounds are normal. She exhibits no distension and no mass. There is tenderness. There is no rebound and no guarding.  Diffusely tender. Obese  Genitourinary:  Genitourinary Comments: No flank tenderness  Musculoskeletal: Normal range of motion. She exhibits no edema or tenderness.  Neurological: She is alert. Coordination normal.  Skin: Skin is warm and dry. No rash noted.  Psychiatric: She has a normal mood and affect.  Nursing note and vitals reviewed.    ED Treatments / Results  Labs (all labs ordered are listed, but only abnormal results are displayed) Labs Reviewed  COMPREHENSIVE METABOLIC PANEL - Abnormal; Notable for the following:       Result Value   Glucose, Bld 112 (*)    All other components within normal limits  I-STAT BETA HCG BLOOD, ED ([facility], [facility], [facility] ONLY) - Abnormal; Notable for the following:    I-stat hCG, quantitative 6.7 (*)    All other components within normal limits  LIPASE, BLOOD  CBC  URINALYSIS, ROUTINE W REFLEX MICROSCOPIC (NOT AT [facility])    EKG  EKG Interpretation None       Radiology No results found.  Procedures Procedures (including critical care time)  Medications Ordered in ED Medications - No data to display 2:50 PM feels improved after treatment with intravenous fluids, opioids and antiemetics. She is able to drink without nausea or vomiting.  Feels ready to go home. Plan prescription Norco, Reglan. She has an appointment with her primary care physician with whom she can follow up tomorrow Pain is felt to be nonspecific. Results for orders placed or performed during the hospital encounter of 11/20/15  Lipase, blood  Result Value Ref Range   Lipase 29 11 - 51 U/L  Comprehensive metabolic panel  Result Value Ref Range   Sodium 140 135 - 145 mmol/L   Potassium 3.8 3.5 - 5.1 mmol/L   Chloride 109 101 - 111 mmol/L   CO2 25 22 - 32 mmol/L   Glucose, Bld 112 (H) 65 - 99 mg/dL   BUN 12 6 - 20 mg/dL   Creatinine, Ser 0.71 0.44 -  1.00 mg/dL   Calcium 9.3 8.9 - 10.3 mg/dL   Total Protein 7.1 6.5 - 8.1 g/dL   Albumin 4.2 3.5 - 5.0 g/dL   AST 25 15 - 41 U/L   ALT 32 14 - 54 U/L   Alkaline Phosphatase 82 38 - 126 U/L   Total Bilirubin 0.8 0.3 - 1.2 mg/dL   GFR calc non Af Amer >60 >60 mL/min   GFR calc Af Amer >60 >60 mL/min   Anion gap 6 5 - 15  CBC  Result Value Ref Range   WBC 6.3 4.0 - 10.5 K/uL   RBC 5.08 3.87 - 5.11 MIL/uL  Hemoglobin 14.7 12.0 - 15.0 g/dL   HCT 44.5 36.0 - 46.0 %   MCV 87.6 78.0 - 100.0 fL   MCH 28.9 26.0 - 34.0 pg   MCHC 33.0 30.0 - 36.0 g/dL   RDW 13.0 11.5 - 15.5 %   Platelets 244 150 - 400 K/uL  Urinalysis, Routine w reflex microscopic  Result Value Ref Range   Color, Urine YELLOW YELLOW   APPearance CLEAR CLEAR   Specific Gravity, Urine 1.028 1.005 - 1.030   pH 5.5 5.0 - 8.0   Glucose, UA NEGATIVE NEGATIVE mg/dL   Hgb urine dipstick NEGATIVE NEGATIVE   Bilirubin Urine NEGATIVE NEGATIVE   Ketones, ur NEGATIVE NEGATIVE mg/dL   Protein, ur NEGATIVE NEGATIVE mg/dL   Nitrite NEGATIVE NEGATIVE   Leukocytes, UA NEGATIVE NEGATIVE  Pregnancy, urine  Result Value Ref Range   Preg Test, Ur NEGATIVE NEGATIVE  I-Stat beta hCG blood, ED  Result Value Ref Range   I-stat hCG, quantitative 6.7 (H) <5 mIU/mL   Comment 3           Ct Abdomen Pelvis W Contrast  Result Date: 11/20/2015 CLINICAL DATA:  Right flank pain radiating into the right lower quadrant with nausea and diarrhea for 8 days. Discolored urine. EXAM: CT ABDOMEN AND PELVIS WITH CONTRAST TECHNIQUE: Multidetector CT imaging of the abdomen and pelvis was performed using the standard protocol following bolus administration of intravenous contrast. CONTRAST:  100 ml ISOVUE-300 IOPAMIDOL (ISOVUE-300) INJECTION 61% COMPARISON:  CT abdomen and pelvis 04/20/2014. FINDINGS: Dependent atelectasis is seen in the lung bases. No pleural or pericardial effusion. The liver is low attenuating consistent with fatty infiltration. No focal liver  lesion is identified. The gallbladder, spleen, adrenal glands, pancreas and kidneys appear normal. The patient is status post appendectomy. A few sigmoid diverticula are seen but there is no evidence of diverticulitis. The colon is otherwise unremarkable. The stomach and small bowel appear normal. Uterus, adnexa and urinary bladder appear normal. No lymphadenopathy or fluid. No lytic or sclerotic bony lesion is seen. IMPRESSION: No acute abnormality abdomen or pelvis. No finding to explain the patient's symptoms. Fatty infiltration of the liver. Mild diverticulosis without diverticulitis. Status post appendectomy. Electronically Signed   By: Inge Rise M.D.   On: 11/20/2015 13:48   Initial Impression / Assessment and Plan / ED Course  I have reviewed the triage vital signs and the nursing notes.  Pertinent labs & imaging results that were available during my care of the patient were reviewed by me and considered in my medical decision making (see chart for details).  Clinical Course   Imodium for diarrhea. Avoid dairy. Encourage hydration   Final Clinical Impressions(s) / ED Diagnoses  Diagnosis #1abdominal pain #2 diarrhea Final diagnoses:  None    New Prescriptions New Prescriptions   No medications on file     Orlie Dakin, MD 11/20/15 [phone]

## 2015-11-20 NOTE — Discharge Instructions (Signed)
Keep your scheduled appointment with your primary care physician tomorrow. Make sure that you drink at least six 8 ounce glasses of water or Gatorade each day. Avoid milk or foods containing milk such as cheese or ice cream all having diarrhea. Take Imodium as directed for diarrhea. Take Tylenol for mild pain or the pain medicine prescribed for bad pain. Don't take Tylenol together with the pain medicine prescribed as combination could be dangerous to your liver.

## 2015-11-20 NOTE — ED Notes (Addendum)
Pt states she started having back pain on Sunday 11/12/15 along with diarrhea. Stated the pain radiates around from her back to her flanks into her mid abd.

## 2016-02-13 ENCOUNTER — Emergency Department (HOSPITAL_COMMUNITY): Payer: 59

## 2016-02-13 ENCOUNTER — Encounter (HOSPITAL_COMMUNITY): Payer: Self-pay | Admitting: Emergency Medicine

## 2016-02-13 ENCOUNTER — Emergency Department (HOSPITAL_COMMUNITY)
Admission: EM | Admit: 2016-02-13 | Discharge: 2016-02-13 | Disposition: A | Discharge status: Home / Self Care | Payer: 59 | Attending: Emergency Medicine | Admitting: Emergency Medicine

## 2016-02-13 DIAGNOSIS — Z79899 Other long term (current) drug therapy: Secondary | ICD-10-CM | POA: Diagnosis not present

## 2016-02-13 DIAGNOSIS — Z87891 Personal history of nicotine dependence: Secondary | ICD-10-CM | POA: Insufficient documentation

## 2016-02-13 DIAGNOSIS — R109 Unspecified abdominal pain: Secondary | ICD-10-CM | POA: Diagnosis present

## 2016-02-13 DIAGNOSIS — N3000 Acute cystitis without hematuria: Secondary | ICD-10-CM | POA: Insufficient documentation

## 2016-02-13 LAB — COMPREHENSIVE METABOLIC PANEL
ALT: 44 U/L (ref 14–54)
AST: 29 U/L (ref 15–41)
Albumin: 4.1 g/dL (ref 3.5–5.0)
Alkaline Phosphatase: 98 U/L (ref 38–126)
Anion gap: 6 (ref 5–15)
BILIRUBIN TOTAL: 0.8 mg/dL (ref 0.3–1.2)
BUN: 13 mg/dL (ref 6–20)
CO2: 24 mmol/L (ref 22–32)
CREATININE: 0.71 mg/dL (ref 0.44–1.00)
Calcium: 9 mg/dL (ref 8.9–10.3)
Chloride: 109 mmol/L (ref 101–111)
Glucose, Bld: 110 mg/dL — ABNORMAL HIGH (ref 65–99)
Potassium: 3.9 mmol/L (ref 3.5–5.1)
Sodium: 139 mmol/L (ref 135–145)
TOTAL PROTEIN: 6.8 g/dL (ref 6.5–8.1)

## 2016-02-13 LAB — URINALYSIS, ROUTINE W REFLEX MICROSCOPIC
Bilirubin Urine: NEGATIVE
Glucose, UA: NEGATIVE mg/dL
HGB URINE DIPSTICK: NEGATIVE
Ketones, ur: NEGATIVE mg/dL
NITRITE: NEGATIVE
PROTEIN: NEGATIVE mg/dL
Specific Gravity, Urine: 1.021 (ref 1.005–1.030)
pH: 7 (ref 5.0–8.0)

## 2016-02-13 LAB — URINE MICROSCOPIC-ADD ON: RBC / HPF: NONE SEEN RBC/hpf (ref 0–5)

## 2016-02-13 LAB — CBC WITH DIFFERENTIAL/PLATELET
BASOS ABS: 0 10*3/uL (ref 0.0–0.1)
BASOS PCT: 0 %
EOS ABS: 0.2 10*3/uL (ref 0.0–0.7)
EOS PCT: 3 %
HCT: 41 % (ref 36.0–46.0)
Hemoglobin: 14.3 g/dL (ref 12.0–15.0)
Lymphocytes Relative: 31 %
Lymphs Abs: 2.3 10*3/uL (ref 0.7–4.0)
MCH: 29.7 pg (ref 26.0–34.0)
MCHC: 34.9 g/dL (ref 30.0–36.0)
MCV: 85.1 fL (ref 78.0–100.0)
Monocytes Absolute: 0.8 10*3/uL (ref 0.1–1.0)
Monocytes Relative: 10 %
Neutro Abs: 4.3 10*3/uL (ref 1.7–7.7)
Neutrophils Relative %: 56 %
PLATELETS: 228 10*3/uL (ref 150–400)
RBC: 4.82 MIL/uL (ref 3.87–5.11)
RDW: 12.6 % (ref 11.5–15.5)
WBC: 7.7 10*3/uL (ref 4.0–10.5)

## 2016-02-13 LAB — POC URINE PREG, ED: PREG TEST UR: NEGATIVE

## 2016-02-13 MED ORDER — CEPHALEXIN 500 MG PO CAPS: 500.0000 mg | ORAL_CAPSULE | Freq: Four times a day (QID) | ORAL | 0 refills | Status: DC

## 2016-02-13 MED ORDER — IOPAMIDOL (ISOVUE-300) INJECTION 61%
100.0000 mL | Freq: Once | INTRAVENOUS | Status: AC | PRN
  Administered 2016-02-13: 100 mL via INTRAVENOUS

## 2016-02-13 MED ORDER — NAPROXEN 500 MG PO TABS: 500.0000 mg | ORAL_TABLET | Freq: Two times a day (BID) | ORAL | 0 refills | Status: DC

## 2016-02-13 MED ORDER — IOPAMIDOL (ISOVUE-300) INJECTION 61%
INTRAVENOUS | Status: AC
  Filled 2016-02-13: qty 100

## 2016-02-13 MED ORDER — ONDANSETRON HCL 4 MG/2ML IJ SOLN
4.0000 mg | Freq: Once | INTRAMUSCULAR | Status: AC
  Administered 2016-02-13: 4 mg via INTRAVENOUS
  Filled 2016-02-13: qty 2

## 2016-02-13 MED ORDER — SODIUM CHLORIDE 0.9 % IJ SOLN
INTRAMUSCULAR | Status: AC
  Filled 2016-02-13: qty 50

## 2016-02-13 MED ORDER — SODIUM CHLORIDE 0.9 % IV BOLUS (SEPSIS)
1000.0000 mL | Freq: Once | INTRAVENOUS | Status: AC
  Administered 2016-02-13: 1000 mL via INTRAVENOUS

## 2016-02-13 MED ORDER — MORPHINE SULFATE (PF) 4 MG/ML IV SOLN
4.0000 mg | Freq: Once | INTRAVENOUS | Status: AC
  Administered 2016-02-13: 4 mg via INTRAVENOUS
  Filled 2016-02-13: qty 1

## 2016-02-13 NOTE — ED Triage Notes (Signed)
Patient c/o left side pain since Sunday when she was constipated and straining to have BM. Patient states that she has little bleeding when has stool now. Patient also has nausea.

## 2016-02-13 NOTE — ED Provider Notes (Signed)
[facility] DEPT Provider Note   CSN: [id number] Arrival date & time: 02/13/16  1539     History   Chief Complaint Chief Complaint  Patient presents with  . Abdominal Pain    HPI Jill Vargas is a 50 y.o. female.  HPI    Jill Vargas is a 50 y.o. female, with a history of Ovarian cysts, uterine fibroids, and recurrent constipation, presenting to the ED with left sided abdominal pain for the last three days. Pt endorses constipation on November 19. Does not know how long she was constipated prior to this. Had a BM on Nov 19 that was large and hard. Pt has been having regular BMs since Nov 19 with last BM this morning, which was normal. Pain waxes and wanes, is moderate to severe, sharp, radiates to the left lower back. Endorses small amount of bright red blood on the paper with BM today, as well as nausea. Drinks 4-5 pints of water a day. Has not taken any medications for her symptoms.   Denies fever/chills, urinary complaints, vomiting, diarrhea, or any other complaints.   No menstrual cycle since uterine ablation two years ago.    Past Medical History:  Diagnosis Date  . Chronic back pain    history related to cyst/fibroids  . Chronic pelvic pain in female   . Constipation    history  . Ectopic pregnancy    surgical removal  . Fibroid uterus   . Hydrosalpinx 10/2013   left  . Hyperlipemia    diet controlled - no meds  . Infection    UTI  . Menorrhagia   . Ovarian cyst 11/2013   left  . SVD (spontaneous vaginal delivery)    x 3    Patient Active Problem List   Diagnosis Date Noted  . Hydrosalpinx 05/23/2014  . Abnormal thyroid stimulating hormone (TSH) level 04/29/2014  . HLD (hyperlipidemia) 04/29/2014  . HA (headache)     Past Surgical History:  Procedure Laterality Date  . ABLATION    . APPENDECTOMY    . DILITATION & CURRETTAGE/HYSTROSCOPY WITH NOVASURE ABLATION  04/06/2012   Procedure: DILATATION & CURETTAGE/HYSTEROSCOPY WITH NOVASURE  ABLATION;  Surgeon: Lavonia Drafts, MD;  Location: [facility];  Service: Gynecology;  Laterality: N/A;  . LAPAROSCOPIC BILATERAL SALPINGECTOMY Left 08/18/2014   Procedure: LAPAROSCOPIC LEFT  SALPINGECTOMY;  Surgeon: Woodroe Mode, MD;  Location: [facility];  Service: Gynecology;  Laterality: Left;  . SALPINGECTOMY  1998  . WISDOM TOOTH EXTRACTION      OB History    Gravida Para Term Preterm AB Living   4 3 1 2 1 3    SAB TAB Ectopic Multiple Live Births   0 0 1 0         Home Medications    Prior to Admission medications   Medication Sig Start Date End Date Taking? Authorizing Provider  cephALEXin (KEFLEX) 500 MG capsule Take 1 capsule (500 mg total) by mouth 4 (four) times daily. 02/13/16   Elia Nunley C Roxsana Riding, PA-C  HYDROcodone-acetaminophen (NORCO) 5-325 MG tablet Take 1 tablet by mouth every 6 (six) hours as needed for severe pain. Patient not taking: Reported on 02/13/2016 11/20/15   Orlie Dakin, MD  metoCLOPramide (REGLAN) 10 MG tablet Take 1 tablet (10 mg total) by mouth every 6 (six) hours as needed for nausea (nausea/headache). Patient not taking: Reported on 02/13/2016 11/20/15   Orlie Dakin, MD  naproxen (NAPROSYN) 500 MG tablet Take 1 tablet (500 mg total) by mouth 2 (two) times  daily. 02/13/16   Lorayne Bender, PA-C    Family History Family History  Problem Relation Age of Onset  . Hypertension Mother   . Diabetes Father     Social History Social History  Substance Use Topics  . Smoking status: Former Smoker    Packs/day: 0.25    Years: 8.00    Types: Cigarettes    Quit date: 01/23/2001  . Smokeless tobacco: Never Used  . Alcohol use No     Allergies   Patient has no known allergies.   Review of Systems Review of Systems  Constitutional: Negative for chills and fever.  Gastrointestinal: Positive for abdominal pain, constipation and nausea. Negative for vomiting.  All other systems reviewed and are negative.    Physical Exam Updated Vital Signs BP  124/80 (BP Location: Right Arm)   Pulse 75   Temp 97.7 F (36.5 C) (Oral)   Resp 18   Ht 5\' 2"  (1.575 m)   Wt 72.6 kg   SpO2 97%   BMI 29.26 kg/m   Physical Exam  Constitutional: She appears well-developed and well-nourished. No distress.  HENT:  Head: Normocephalic and atraumatic.  Eyes: Conjunctivae are normal.  Neck: Neck supple.  Cardiovascular: Normal rate, regular rhythm, normal heart sounds and intact distal pulses.   Pulmonary/Chest: Effort normal and breath sounds normal. No respiratory distress.  Abdominal: Soft. There is tenderness in the left lower quadrant. There is CVA tenderness (left). There is no guarding.  Musculoskeletal: She exhibits no edema.  Lymphadenopathy:    She has no cervical adenopathy.  Neurological: She is alert.  Skin: Skin is warm and dry. She is not diaphoretic.  Psychiatric: She has a normal mood and affect. Her behavior is normal.  Nursing note and vitals reviewed.    ED Treatments / Results  Labs (all labs ordered are listed, but only abnormal results are displayed) Labs Reviewed  COMPREHENSIVE METABOLIC PANEL - Abnormal; Notable for the following:       Result Value   Glucose, Bld 110 (*)    All other components within normal limits  URINALYSIS, ROUTINE W REFLEX MICROSCOPIC (NOT AT [facility]) - Abnormal; Notable for the following:    APPearance CLOUDY (*)    Leukocytes, UA MODERATE (*)    All other components within normal limits  URINE MICROSCOPIC-ADD ON - Abnormal; Notable for the following:    Squamous Epithelial / LPF 6-30 (*)    Bacteria, UA MANY (*)    All other components within normal limits  URINE CULTURE  CBC WITH DIFFERENTIAL/PLATELET  POC URINE PREG, ED    EKG  EKG Interpretation None       Radiology Ct Abdomen Pelvis W Contrast  Result Date: 02/13/2016 CLINICAL DATA:  Left side pain starting Sunday, recent constipation EXAM: CT ABDOMEN AND PELVIS WITH CONTRAST TECHNIQUE: Multidetector CT imaging of the abdomen  and pelvis was performed using the standard protocol following bolus administration of intravenous contrast. CONTRAST:  156mL ISOVUE-300 IOPAMIDOL (ISOVUE-300) INJECTION 61% COMPARISON:  11/20/2015 FINDINGS: Lower chest: No acute abnormality. Hepatobiliary: Mild fatty infiltration of the liver. No focal hepatic mass. Or calcified gallstones are noted within gallbladder. Pancreas: Enhanced pancreas is normal.  No peripancreatic stranding. Spleen: Enhanced spleen is normal. Adrenals/Urinary Tract: No adrenal gland mass. Kidneys are symmetrical in size and enhancement. No hydronephrosis or hydroureter. Delayed renal images shows bilateral renal symmetrical excretion. Bilateral visualized proximal ureter is unremarkable. The urinary bladder is unremarkable. Stomach/Bowel: There is no small bowel obstruction. Some colonic  stool and gas noted in right colon and transverse colon. No pericecal inflammation. The terminal ileum is unremarkable. The patient is status post appendectomy. Some colonic stool are noted in sigmoid colon and rectum. No evidence of distal colitis or diverticulitis. No colonic obstruction. Vascular/Lymphatic: No aortic aneurysm. No retroperitoneal or mesenteric adenopathy. Reproductive: The uterus is anteflexed normal size. No adnexal masses noted. Other: No ascites or free abdominal air. Small nonspecific bilateral inguinal lymph nodes are noted. Musculoskeletal: No destructive bony lesions are noted. Sagittal images of the spine shows mild degenerative changes thoracolumbar spine. IMPRESSION: 1. There is no evidence of acute inflammatory process within abdomen. 2. Mild fatty infiltration of the liver. 3. No hydronephrosis or hydroureter. 4. No pericecal inflammation. Status post appendectomy. No colitis or diverticulitis. 5. No pelvic mass or adenopathy. 6. Mild degenerative changes thoracolumbar spine. Electronically Signed   By: Lahoma Crocker M.D.   On: 02/13/2016 17:32    Procedures Procedures  (including critical care time)  Medications Ordered in ED Medications  sodium chloride 0.9 % bolus 1,000 mL (0 mLs Intravenous Stopped 02/13/16 1802)  morphine 4 MG/ML injection 4 mg (4 mg Intravenous Given 02/13/16 1644)  ondansetron (ZOFRAN) injection 4 mg (4 mg Intravenous Given 02/13/16 1659)  iopamidol (ISOVUE-300) 61 % injection 100 mL (100 mLs Intravenous Contrast Given 02/13/16 1712)     Initial Impression / Assessment and Plan / ED Course  I have reviewed the triage vital signs and the nursing notes.  Pertinent labs & imaging results that were available during my care of the patient were reviewed by me and considered in my medical decision making (see chart for details).  Clinical Course     Patient presents with abdominal pain for the last 3 days. Patient is nontoxic appearing and vital signs are stable. No acute abnormalities on CT. UTI on UA. PCP follow-up. Return precautions discussed.    Vitals:   02/13/16 1542 02/13/16 1803  BP: 124/80 150/82  Pulse: 75 63  Resp: 18 20  Temp: 97.7 F (36.5 C) 97.5 F (36.4 C)  TempSrc: Oral Oral  SpO2: 97% 100%  Weight: 72.6 kg   Height: 5\' 2"  (1.575 m)      Final Clinical Impressions(s) / ED Diagnoses   Final diagnoses:  Acute cystitis without hematuria    New Prescriptions Discharge Medication List as of 02/13/2016  5:43 PM    START taking these medications   Details  cephALEXin (KEFLEX) 500 MG capsule Take 1 capsule (500 mg total) by mouth 4 (four) times daily., Starting Tue 02/13/2016, Print    naproxen (NAPROSYN) 500 MG tablet Take 1 tablet (500 mg total) by mouth 2 (two) times daily., Starting Tue 02/13/2016, Print         Lorayne Bender, PA-C 02/14/16 0109    Orlie Dakin, MD 02/14/16 [phone]

## 2016-02-13 NOTE — ED Notes (Signed)
Pt complaining of nausea, will inform PA.

## 2016-02-13 NOTE — Discharge Instructions (Signed)
There were signs of an urinary tract infection, but no other abnormalities on lab results or imaging. Please take all of your antibiotics until finished!   You may develop abdominal discomfort or diarrhea from the antibiotic.  You may help offset this with probiotics which you can buy or get in yogurt. Do not eat or take the probiotics until 2 hours after your antibiotic.  Follow-up with a primary care provider for continued treatment of this issue.

## 2016-02-13 NOTE — ED Notes (Signed)
Pt in CT.

## 2016-02-13 NOTE — ED Notes (Signed)
Pt returned back from radiology.

## 2016-02-15 LAB — URINE CULTURE: Culture: >=100000 — AB

## 2017-09-07 ENCOUNTER — Emergency Department (HOSPITAL_COMMUNITY): Payer: Commercial Managed Care - PPO

## 2017-09-07 ENCOUNTER — Emergency Department (HOSPITAL_COMMUNITY)
Admission: EM | Admit: 2017-09-07 | Discharge: 2017-09-08 | Disposition: A | Discharge status: Home / Self Care | Payer: Commercial Managed Care - PPO | Attending: Emergency Medicine | Admitting: Emergency Medicine

## 2017-09-07 ENCOUNTER — Other Ambulatory Visit: Payer: Self-pay

## 2017-09-07 ENCOUNTER — Encounter (HOSPITAL_COMMUNITY): Payer: Self-pay | Admitting: *Deleted

## 2017-09-07 DIAGNOSIS — Z79899 Other long term (current) drug therapy: Secondary | ICD-10-CM | POA: Diagnosis not present

## 2017-09-07 DIAGNOSIS — E785 Hyperlipidemia, unspecified: Secondary | ICD-10-CM | POA: Diagnosis not present

## 2017-09-07 DIAGNOSIS — R1110 Vomiting, unspecified: Secondary | ICD-10-CM

## 2017-09-07 DIAGNOSIS — Z87891 Personal history of nicotine dependence: Secondary | ICD-10-CM | POA: Insufficient documentation

## 2017-09-07 DIAGNOSIS — R1013 Epigastric pain: Secondary | ICD-10-CM | POA: Insufficient documentation

## 2017-09-07 DIAGNOSIS — R1011 Right upper quadrant pain: Secondary | ICD-10-CM | POA: Diagnosis not present

## 2017-09-07 LAB — COMPREHENSIVE METABOLIC PANEL
ALT: 36 U/L (ref 14–54)
AST: 28 U/L (ref 15–41)
Albumin: 3.7 g/dL (ref 3.5–5.0)
Alkaline Phosphatase: 97 U/L (ref 38–126)
Anion gap: 9 (ref 5–15)
BUN: 9 mg/dL (ref 6–20)
CHLORIDE: 108 mmol/L (ref 101–111)
CO2: 23 mmol/L (ref 22–32)
Calcium: 9.2 mg/dL (ref 8.9–10.3)
Creatinine, Ser: 0.84 mg/dL (ref 0.44–1.00)
Glucose, Bld: 164 mg/dL — ABNORMAL HIGH (ref 65–99)
Potassium: 3.6 mmol/L (ref 3.5–5.1)
Sodium: 140 mmol/L (ref 135–145)
Total Bilirubin: 0.7 mg/dL (ref 0.3–1.2)
Total Protein: 6.4 g/dL — ABNORMAL LOW (ref 6.5–8.1)

## 2017-09-07 LAB — URINALYSIS, ROUTINE W REFLEX MICROSCOPIC
BILIRUBIN URINE: NEGATIVE
Bacteria, UA: NONE SEEN
Glucose, UA: NEGATIVE mg/dL
Hgb urine dipstick: NEGATIVE
KETONES UR: NEGATIVE mg/dL
Nitrite: NEGATIVE
PH: 6 (ref 5.0–8.0)
PROTEIN: NEGATIVE mg/dL
Specific Gravity, Urine: 1.027 (ref 1.005–1.030)

## 2017-09-07 LAB — CBC
HCT: 43.8 % (ref 36.0–46.0)
HEMOGLOBIN: 14.5 g/dL (ref 12.0–15.0)
MCH: 29.3 pg (ref 26.0–34.0)
MCHC: 33.1 g/dL (ref 30.0–36.0)
MCV: 88.5 fL (ref 78.0–100.0)
PLATELETS: 252 10*3/uL (ref 150–400)
RBC: 4.95 MIL/uL (ref 3.87–5.11)
RDW: 12.2 % (ref 11.5–15.5)
WBC: 7.2 10*3/uL (ref 4.0–10.5)

## 2017-09-07 LAB — I-STAT TROPONIN, ED: Troponin i, poc: 0 ng/mL (ref 0.00–0.08)

## 2017-09-07 LAB — POC OCCULT BLOOD, ED: Fecal Occult Bld: NEGATIVE

## 2017-09-07 LAB — LIPASE, BLOOD: LIPASE: 35 U/L (ref 11–51)

## 2017-09-07 MED ORDER — ONDANSETRON 4 MG PO TBDP
4.0000 mg | ORAL_TABLET | Freq: Once | ORAL | Status: AC
  Administered 2017-09-07: 4 mg via ORAL
  Filled 2017-09-07: qty 1

## 2017-09-07 MED ORDER — HYDROCODONE-ACETAMINOPHEN 5-325 MG PO TABS: 1.0000 | ORAL_TABLET | Freq: Four times a day (QID) | ORAL | 0 refills | Status: DC | PRN

## 2017-09-07 MED ORDER — ONDANSETRON HCL 4 MG/2ML IJ SOLN
4.0000 mg | Freq: Once | INTRAMUSCULAR | Status: AC
  Administered 2017-09-07: 4 mg via INTRAVENOUS
  Filled 2017-09-07: qty 2

## 2017-09-07 MED ORDER — GI COCKTAIL ~~LOC~~
30.0000 mL | Freq: Once | ORAL | Status: AC
  Administered 2017-09-08: 30 mL via ORAL
  Filled 2017-09-07: qty 30

## 2017-09-07 MED ORDER — FAMOTIDINE IN NACL 20-0.9 MG/50ML-% IV SOLN
20.0000 mg | Freq: Once | INTRAVENOUS | Status: AC
  Administered 2017-09-07: 20 mg via INTRAVENOUS
  Filled 2017-09-07: qty 50

## 2017-09-07 MED ORDER — MORPHINE SULFATE (PF) 4 MG/ML IV SOLN
4.0000 mg | Freq: Once | INTRAVENOUS | Status: AC
  Administered 2017-09-07: 4 mg via INTRAVENOUS
  Filled 2017-09-07: qty 1

## 2017-09-07 MED ORDER — HYDROMORPHONE HCL 2 MG/ML IJ SOLN
0.5000 mg | Freq: Once | INTRAMUSCULAR | Status: AC
  Administered 2017-09-07: 0.5 mg via INTRAVENOUS
  Filled 2017-09-07: qty 1

## 2017-09-07 MED ORDER — ONDANSETRON HCL 4 MG PO TABS: 4.0000 mg | ORAL_TABLET | Freq: Four times a day (QID) | ORAL | 0 refills | Status: DC

## 2017-09-07 MED ORDER — PANTOPRAZOLE SODIUM 20 MG PO TBEC: 20.0000 mg | DELAYED_RELEASE_TABLET | Freq: Every day | ORAL | 0 refills | Status: DC

## 2017-09-07 MED ORDER — IOHEXOL 300 MG/ML  SOLN
100.0000 mL | Freq: Once | INTRAMUSCULAR | Status: AC | PRN
  Administered 2017-09-07: 100 mL via INTRAVENOUS

## 2017-09-07 MED ORDER — SUCRALFATE 1 GM/10ML PO SUSP: 1.0000 g | Freq: Three times a day (TID) | ORAL | 0 refills | Status: DC

## 2017-09-07 NOTE — ED Notes (Signed)
The pt reports that she is feeling a little better the pain is still there but not as bad.  She does not want  Any more of the medicine it makes her feel so strange

## 2017-09-07 NOTE — ED Triage Notes (Signed)
Pt states acute onset epigastric pain and vomiting since last night.  Denies diarrhea.

## 2017-09-07 NOTE — ED Provider Notes (Signed)
[facility] EMERGENCY DEPARTMENT Provider Note   CSN: [id number] Arrival date & time: 09/07/17  1805     History   Chief Complaint Chief Complaint  Patient presents with  . Abdominal Pain    HPI Jill Vargas is a 52 y.o. female with history of chronic pelvic pain who presents with acute onset epigastric pain and vomiting since this morning around 2 AM.  She describes the pain is sharp and intermittent.  It radiates to her back, straight through.  She denies any pain like this in the past.  She is unable to keep down fluids.  She has not eaten today.  She is not taking medications at home for symptoms.  She noticed her stools have been a little darker today and looser.  She denies any urinary symptoms or fevers at home.  She has no history of pancreatitis, gallbladder disease.  She has history of appendectomy.  HPI  Past Medical History:  Diagnosis Date  . Chronic back pain    history related to cyst/fibroids  . Chronic pelvic pain in female   . Constipation    history  . Ectopic pregnancy    surgical removal  . Fibroid uterus   . Hydrosalpinx 10/2013   left  . Hyperlipemia    diet controlled - no meds  . Infection    UTI  . Menorrhagia   . Ovarian cyst 11/2013   left  . SVD (spontaneous vaginal delivery)    x 3    Patient Active Problem List   Diagnosis Date Noted  . Hydrosalpinx 05/23/2014  . Abnormal thyroid stimulating hormone (TSH) level 04/29/2014  . HLD (hyperlipidemia) 04/29/2014  . HA (headache)     Past Surgical History:  Procedure Laterality Date  . ABLATION    . APPENDECTOMY    . DILITATION & CURRETTAGE/HYSTROSCOPY WITH NOVASURE ABLATION  04/06/2012   Procedure: DILATATION & CURETTAGE/HYSTEROSCOPY WITH NOVASURE ABLATION;  Surgeon: Lavonia Drafts, MD;  Location: [facility];  Service: Gynecology;  Laterality: N/A;  . LAPAROSCOPIC BILATERAL SALPINGECTOMY Left 08/18/2014   Procedure: LAPAROSCOPIC LEFT  SALPINGECTOMY;  Surgeon:  Woodroe Mode, MD;  Location: [facility];  Service: Gynecology;  Laterality: Left;  . SALPINGECTOMY  1998  . WISDOM TOOTH EXTRACTION       OB History    Gravida  4   Para  3   Term  1   Preterm  2   AB  1   Living  3     SAB  0   TAB  0   Ectopic  1   Multiple  0   Live Births               Home Medications    Prior to Admission medications   Medication Sig Start Date End Date Taking? Authorizing Provider  cephALEXin (KEFLEX) 500 MG capsule Take 1 capsule (500 mg total) by mouth 4 (four) times daily. Patient not taking: Reported on 09/07/2017 02/13/16   Lorayne Bender, PA-C  HYDROcodone-acetaminophen (NORCO/VICODIN) 5-325 MG tablet Take 1-2 tablets by mouth every 6 (six) hours as needed. 09/07/17   Melchizedek Espinola, Bea Graff, PA-C  metoCLOPramide (REGLAN) 10 MG tablet Take 1 tablet (10 mg total) by mouth every 6 (six) hours as needed for nausea (nausea/headache). Patient not taking: Reported on 02/13/2016 11/20/15   Orlie Dakin, MD  naproxen (NAPROSYN) 500 MG tablet Take 1 tablet (500 mg total) by mouth 2 (two) times daily. Patient not taking: Reported on 09/07/2017  02/13/16   Joy, Shawn C, PA-C  ondansetron (ZOFRAN) 4 MG tablet Take 1 tablet (4 mg total) by mouth every 6 (six) hours. 09/07/17   Harmoney Sienkiewicz, Bea Graff, PA-C  pantoprazole (PROTONIX) 20 MG tablet Take 1 tablet (20 mg total) by mouth daily. 09/07/17   Marja Adderley, Bea Graff, PA-C  sucralfate (CARAFATE) 1 GM/10ML suspension Take 10 mLs (1 g total) by mouth 4 (four) times daily -  with meals and at bedtime. 09/07/17   Frederica Kuster, PA-C    Family History Family History  Problem Relation Age of Onset  . Hypertension Mother   . Diabetes Father     Social History Social History   Tobacco Use  . Smoking status: Former Smoker    Packs/day: 0.25    Years: 8.00    Pack years: 2.00    Types: Cigarettes    Last attempt to quit: 01/23/2001    Years since quitting: 16.6  . Smokeless tobacco: Never Used  Substance Use  Topics  . Alcohol use: No  . Drug use: No     Allergies   Patient has no known allergies.   Review of Systems Review of Systems  Constitutional: Negative for chills and fever.  HENT: Negative for facial swelling and sore throat.   Respiratory: Negative for shortness of breath.   Cardiovascular: Negative for chest pain.  Gastrointestinal: Positive for abdominal pain, diarrhea (looser), nausea and vomiting.  Genitourinary: Negative for dysuria.  Musculoskeletal: Negative for back pain.  Skin: Negative for rash and wound.  Neurological: Negative for headaches.  Psychiatric/Behavioral: The patient is not nervous/anxious.      Physical Exam Updated Vital Signs BP (!) 142/91 (BP Location: Right Arm)   Pulse 60   Temp 98.2 F (36.8 C)   Resp 14   Ht 5\' 2"  (1.575 m)   Wt 72.6 kg (160 lb)   SpO2 96%   BMI 29.26 kg/m   Physical Exam  Constitutional: She appears well-developed and well-nourished. No distress.  HENT:  Head: Normocephalic and atraumatic.  Mouth/Throat: Oropharynx is clear and moist. No oropharyngeal exudate.  Eyes: Pupils are equal, round, and reactive to light. Conjunctivae are normal. Right eye exhibits no discharge. Left eye exhibits no discharge. No scleral icterus.  Neck: Normal range of motion. Neck supple. No thyromegaly present.  Cardiovascular: Normal rate, regular rhythm, normal heart sounds and intact distal pulses. Exam reveals no gallop and no friction rub.  No murmur heard. Pulmonary/Chest: Effort normal and breath sounds normal. No stridor. No respiratory distress. She has no wheezes. She has no rales.  Abdominal: Soft. Bowel sounds are normal. She exhibits no distension. There is tenderness in the right upper quadrant and epigastric area. There is positive Murphy's sign. There is no rebound and no guarding.  Abdominal tenderness worse in the right upper quadrant and epigastrium, left upper quadrant causes pain in the right upper quadrant    Musculoskeletal: She exhibits no edema.  Lymphadenopathy:    She has no cervical adenopathy.  Neurological: She is alert. Coordination normal.  Skin: Skin is warm and dry. No rash noted. She is not diaphoretic. No pallor.  Psychiatric: She has a normal mood and affect.  Nursing note and vitals reviewed.    ED Treatments / Results  Labs (all labs ordered are listed, but only abnormal results are displayed) Labs Reviewed  COMPREHENSIVE METABOLIC PANEL - Abnormal; Notable for the following components:      Result Value   Glucose, Bld 164 (*)  Total Protein 6.4 (*)    All other components within normal limits  URINALYSIS, ROUTINE W REFLEX MICROSCOPIC - Abnormal; Notable for the following components:   Leukocytes, UA TRACE (*)    All other components within normal limits  LIPASE, BLOOD  CBC  POC OCCULT BLOOD, ED  I-STAT TROPONIN, ED    EKG EKG Interpretation  Date/Time:  Sunday September 07 2017 18:25:14 EDT Ventricular Rate:  66 PR Interval:  166 QRS Duration: 82 QT Interval:  414 QTC Calculation: 434 R Axis:   50 Text Interpretation:  Normal sinus rhythm Nonspecific T wave abnormality Abnormal ECG No significant change since last tracing Confirmed by Theotis Burrow [phone]) on 09/07/2017 6:50:56 PM Also confirmed by Theotis Burrow [id number]), editor Hattie Perch ([id number])  on 09/08/2017 7:14:31 AM   Radiology Ct Abdomen Pelvis W Contrast  Result Date: 09/07/2017 CLINICAL DATA:  Epigastric pain EXAM: CT ABDOMEN AND PELVIS WITH CONTRAST TECHNIQUE: Multidetector CT imaging of the abdomen and pelvis was performed using the standard protocol following bolus administration of intravenous contrast. CONTRAST:  169mL OMNIPAQUE IOHEXOL 300 MG/ML  SOLN COMPARISON:  Ultrasound 09/07/2017, CT 02/13/2016 FINDINGS: Lower chest: No acute abnormality. Hepatobiliary: No focal liver abnormality is seen. No gallstones, gallbladder wall thickening, or biliary dilatation. Pancreas: Unremarkable. No  pancreatic ductal dilatation or surrounding inflammatory changes. Spleen: Normal in size without focal abnormality. Adrenals/Urinary Tract: Adrenal glands are unremarkable. Kidneys are normal, without renal calculi, focal lesion, or hydronephrosis. Bladder is unremarkable. Stomach/Bowel: Stomach is within normal limits. Status post appendectomy. No evidence of bowel wall thickening, distention, or inflammatory changes. Vascular/Lymphatic: No significant vascular findings are present. No enlarged abdominal or pelvic lymph nodes. Reproductive: Uterus and bilateral adnexa are unremarkable. Other: Negative for free air or free fluid. Musculoskeletal: No acute or significant osseous findings. IMPRESSION: No CT evidence for acute intra-abdominal or pelvic abnormality Electronically Signed   By: Donavan Foil M.D.   On: 09/07/2017 22:54   US Abdomen Limited Ruq  Result Date: 09/07/2017 CLINICAL DATA:  RIGHT upper quadrant pain and epigastric pain EXAM: ULTRASOUND ABDOMEN LIMITED RIGHT UPPER QUADRANT COMPARISON:  None. FINDINGS: Gallbladder: No gallstones or wall thickening visualized. No sonographic Murphy sign noted by sonographer. Common bile duct: Diameter: Normal at 4 mm Liver: There is mild uniform increase in hepatic echogenicity. No biliary duct dilatation. portal vein is patent on color Doppler imaging with normal direction of blood flow towards the liver. IMPRESSION: 1. Normal gallbladder. 2. Increased hepatic echogenicity commonly represents hepatic steatosis. Electronically Signed   By: Suzy Bouchard M.D.   On: 09/07/2017 20:56    Procedures Procedures (including critical care time)  Medications Ordered in ED Medications  ondansetron (ZOFRAN-ODT) disintegrating tablet 4 mg (4 mg Oral Given 09/07/17 1826)  morphine 4 MG/ML injection 4 mg (4 mg Intravenous Given 09/07/17 1945)  famotidine (PEPCID) IVPB 20 mg premix (0 mg Intravenous Stopped 09/07/17 2229)  HYDROmorphone (DILAUDID) injection 0.5 mg  (0.5 mg Intravenous Given 09/07/17 2112)  ondansetron (ZOFRAN) injection 4 mg (4 mg Intravenous Given 09/07/17 2109)  iohexol (OMNIPAQUE) 300 MG/ML solution 100 mL (100 mLs Intravenous Contrast Given 09/07/17 2156)  gi cocktail (Maalox,Lidocaine,Donnatal) (30 mLs Oral Given 09/08/17 0012)     Initial Impression / Assessment and Plan / ED Course  I have reviewed the triage vital signs and the nursing notes.  Pertinent labs & imaging results that were available during my care of the patient were reviewed by me and considered in my medical decision making (see chart for details).  Clinical Course as of Sep 09 1631  Sun Sep 07, 2017  2102 On reassessment after morphine, patient states her pain is unchanged.  Her nausea is returning.  Will order Dilaudid and Zofran.   [AL]    Clinical Course User Index [AL] Frederica Kuster, PA-C    Patient with upper abdominal pain and nausea and emesis.  Suspect biliary colic versus ulcer disease.  Labs are unremarkable for acute findings.  Right upper quadrant ultrasound is normal as well as CT abdomen pelvis.  Patient has remained afebrile with stable vitals.  Patient will be discharged home with supportive treatment, including Zofran, Carafate, Protonix, and Norco, and referral to gastroenterology for further evaluation and treatment.  I  reviewed the [place] narcotic database and found no discrepancies. Strict return precautions were discussed and patient promises to return if her symptoms are worsening or changing.  Patient understands and agrees with plan.  Patient vitals stable throughout ED course and discharged in satisfactory condition.  Final Clinical Impressions(s) / ED Diagnoses   Final diagnoses:  Vomiting  RUQ pain  Epigastric pain    ED Discharge Orders        Ordered    pantoprazole (PROTONIX) 20 MG tablet  Daily     09/07/17 2355    ondansetron (ZOFRAN) 4 MG tablet  Every 6 hours     09/07/17 2355    HYDROcodone-acetaminophen  (NORCO/VICODIN) 5-325 MG tablet  Every 6 hours PRN     09/07/17 2355    sucralfate (CARAFATE) 1 GM/10ML suspension  3 times daily with meals & bedtime     09/07/17 2355       Frederica Kuster, PA-C 09/08/17 1635    Little, Wenda Overland, MD 09/12/17 1047

## 2017-09-08 NOTE — ED Notes (Signed)
Pt understood dc material. NAD noted. Scripts given at dc 

## 2017-09-08 NOTE — Discharge Instructions (Addendum)
Take Carafate as prescribed.  Take Zofran every 6 hours as needed for nausea or vomiting.  Take Protonix once daily as prescribed.  For breakthrough pain, take 1-2 Norco every 6 hours as needed.  Do not drive or operate machinery while taking this medication.  Please follow-up with gastroenterology by calling one of the numbers below.  Please return to the emergency department immediately if you develop any new or worsening symptoms.  Do not drink alcohol, drive, operate machinery or participate in any other potentially dangerous activities while taking opiate pain medication as it may make you sleepy. Do not take this medication with any other sedating medications, either prescription or over-the-counter. If you were prescribed Percocet or Vicodin, do not take these with acetaminophen (Tylenol) as it is already contained within these medications and overdose of Tylenol is dangerous.   This medication is an opiate (or narcotic) pain medication and can be habit forming.  Use it as little as possible to achieve adequate pain control.  Do not use or use it with extreme caution if you have a history of opiate abuse or dependence. This medication is intended for your use only - do not give any to anyone else and keep it in a secure place where nobody else, especially children, have access to it. It will also cause or worsen constipation, so you may want to consider taking an over-the-counter stool softener while you are taking this medication.

## 2017-11-12 ENCOUNTER — Encounter: Payer: Self-pay | Admitting: Family Medicine

## 2017-11-12 ENCOUNTER — Ambulatory Visit (INDEPENDENT_AMBULATORY_CARE_PROVIDER_SITE_OTHER): Payer: Commercial Managed Care - PPO | Admitting: Family Medicine

## 2017-11-12 VITALS — BP 128/80 | HR 74 | Ht 62.5 in | Wt 168.2 lb

## 2017-11-12 DIAGNOSIS — R7989 Other specified abnormal findings of blood chemistry: Secondary | ICD-10-CM | POA: Diagnosis not present

## 2017-11-12 DIAGNOSIS — R5383 Other fatigue: Secondary | ICD-10-CM

## 2017-11-12 DIAGNOSIS — R200 Anesthesia of skin: Secondary | ICD-10-CM

## 2017-11-12 DIAGNOSIS — G5601 Carpal tunnel syndrome, right upper limb: Secondary | ICD-10-CM

## 2017-11-12 DIAGNOSIS — R7303 Prediabetes: Secondary | ICD-10-CM | POA: Diagnosis not present

## 2017-11-12 DIAGNOSIS — Z7689 Persons encountering health services in other specified circumstances: Secondary | ICD-10-CM

## 2017-11-12 DIAGNOSIS — R202 Paresthesia of skin: Secondary | ICD-10-CM

## 2017-11-12 DIAGNOSIS — Z23 Encounter for immunization: Secondary | ICD-10-CM | POA: Diagnosis not present

## 2017-11-12 DIAGNOSIS — Z8669 Personal history of other diseases of the nervous system and sense organs: Secondary | ICD-10-CM

## 2017-11-12 DIAGNOSIS — E782 Mixed hyperlipidemia: Secondary | ICD-10-CM | POA: Diagnosis not present

## 2017-11-12 LAB — POCT GLYCOSYLATED HEMOGLOBIN (HGB A1C): HEMOGLOBIN A1C: 5.8 % — AB (ref 4.0–5.6)

## 2017-11-12 NOTE — Progress Notes (Signed)
Subjective:    Patient ID: Jill Vargas, female    DOB: 1965/06/28, 52 y.o.   MRN: [id number]  HPI Chief Complaint  Patient presents with  . new pt    new pt get established. 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  wants blood work done.. no interest in sex    She is new to the practice and here to establish care. Moved here from [place] 9 years ago.  Previous medical care: No PCP in years.   Other providers:  None   Complains of a one month history of fatigue, generalized weakness, decreased appetite.   History of GERD and was seen in the ED for this. Referred to GI but did not go. Is not taking medication currently for this. No longer having reflux.   Reports a 3 week history of right hand with numbness and tingling of mainly her 3rd finger. Also having some right wrist pain and weakness.   History of prediabetes. Hgb A1c 6.0% in 06/2015  Does not check her BS at home.   States she is only eating 1 meal per day. Works in [employer].   Reports she has gained a significant amount of weight over the past 3 years and is unhappy about this.  Does not exercise but walks a lot for her job.   Reports history of glaucoma and has not followed up with an eye specialist due to lack of insurance. Requests referral today for this. States she has a difficult time seeing. No double vision.    Mixed hyperlipidemia- was on a statin for a year but stopped it in 2017. Needs lipid panel.   Married and lives with her spouse. He is a Administrator. She has 2 daughters. 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   LMP: not since ablation. Bilateral ovaries.   Denies fever, chills, dizziness, headache, chest pain, palpitations, shortness of breath, cough, abdominal pain, N/V/D, urinary symptoms, vaginal discharge, LE edema.   Reviewed allergies, medications, past  medical, surgical, family, and social history.    Review of Systems Pertinent positives and negatives in the history of present illness.     Objective:   Physical Exam BP 128/80   Pulse 74   Ht 5' 2.5" (1.588 m)   Wt 168 lb 3.2 oz (76.3 kg)   BMI 30.27 kg/m  Alert and in no distress. Tympanic membranes and canals are normal. Pharyngeal area is normal. Neck is supple without adenopathy or thyromegaly. Cardiac exam shows a regular sinus rhythm without murmurs or gallops. Lungs are clear to auscultation. Extremities without edema. Pulses intact. Upper extremities with equal grip strength. Sensation and motor function normal. Positive Tinel's and Phalen's right hand. PERRLA, EOMs intact. CNs intact. Skin is warm and dry, no pallor or rash.       Assessment & Plan:  Pre-diabetes - Plan: HgB A1c, TSH, T4, free  Mixed hyperlipidemia - Plan: Lipid panel  Abnormal thyroid stimulating hormone (TSH) level - Plan: Vitamin B12  Numbness and tingling in right hand  Carpal tunnel syndrome on right  History of glaucoma  Fatigue, unspecified type - Plan: CBC with Differential/Platelet, Comprehensive metabolic panel, TSH, T4, free, VITAMIN D 25 Hydroxy (Vit-D Deficiency, Fractures), Vitamin B12  Needs flu shot - Plan: Flu Vaccine QUAD 36+ mos IM  Encounter to establish care  She  is new with multiple complaints.  Discussed etiologies for fatigue and will check labs.  Prediabetes with Hgb A1c 5.8%  Hyperlipidemia - has been on medication but not in the past 1-2 years.  Counseling done on healthy diet and exercise to help with chronic health conditions.  Appears to have CTS on right. Discussed conservative treatment for the next 2 weeks with NSAIDs and wrist splint. She will follow up if not improving. Consider referral to hand specialist or nerve conduction testing.  Glaucoma and poor visual acuity- referral to ophthalmologist.  History of abnormal TSH. Will check this today.  Follow up  pending labs.  Spent at least 45 minutes face to face with patient and 50% or more was in counseling and coordination of care.

## 2017-11-12 NOTE — Patient Instructions (Signed)
Your hemoglobin A1c is 5.8% and you are still in the prediabetes range.   Try 2 Aleve twice daily with food for the next couple of weeks for hand pain.  Get a wrist splint and wear this, especially at night.  Let me know if this is not improving.    Carpal Tunnel Syndrome Carpal tunnel syndrome is a condition that causes pain in your hand and arm. The carpal tunnel is a narrow area that is on the palm side of your wrist. Repeated wrist motion or certain diseases may cause swelling in the tunnel. This swelling can pinch the main nerve in the wrist (median nerve). Follow these instructions at home: If you have a splint:  Wear it as told by your doctor. Remove it only as told by your doctor.  Loosen the splint if your fingers: ? Become numb and tingle. ? Turn blue and cold.  Keep the splint clean and dry. General instructions  Take over-the-counter and prescription medicines only as told by your doctor.  Rest your wrist from any activity that may be causing your pain. If needed, talk to your employer about changes that can be made in your work, such as getting a wrist pad to use while typing.  If directed, apply ice to the painful area: ? Put ice in a plastic bag. ? Place a towel between your skin and the bag. ? Leave the ice on for 20 minutes, 2-3 times per day.  Keep all follow-up visits as told by your doctor. This is important.  Do any exercises as told by your doctor, physical therapist, or occupational therapist. Contact a doctor if:  You have new symptoms.  Medicine does not help your pain.  Your symptoms get worse. This information is not intended to replace advice given to you by your health care provider. Make sure you discuss any questions you have with your health care provider. Document Released: 02/28/2011 Document Revised: 08/17/2015 Document Reviewed: 07/27/2014 Elsevier Interactive Patient Education  [employer].

## 2017-11-13 LAB — LIPID PANEL
CHOL/HDL RATIO: 7.8 ratio — AB (ref 0.0–4.4)
Cholesterol, Total: 273 mg/dL — ABNORMAL HIGH (ref 100–199)
HDL: 35 mg/dL — ABNORMAL LOW (ref >39)
Triglycerides: 409 mg/dL — ABNORMAL HIGH (ref 0–149)

## 2017-11-13 LAB — COMPREHENSIVE METABOLIC PANEL
ALBUMIN: 4.3 g/dL (ref 3.5–5.5)
ALT: 36 IU/L — ABNORMAL HIGH (ref 0–32)
AST: 24 IU/L (ref 0–40)
Albumin/Globulin Ratio: 1.8 (ref 1.2–2.2)
Alkaline Phosphatase: 100 IU/L (ref 39–117)
BILIRUBIN TOTAL: 0.6 mg/dL (ref 0.0–1.2)
BUN / CREAT RATIO: 12 (ref 9–23)
BUN: 10 mg/dL (ref 6–24)
CHLORIDE: 106 mmol/L (ref 96–106)
CO2: 21 mmol/L (ref 20–29)
Calcium: 9.4 mg/dL (ref 8.7–10.2)
Creatinine, Ser: 0.86 mg/dL (ref 0.57–1.00)
GFR calc Af Amer: 90 mL/min/{1.73_m2} (ref >59)
GFR calc non Af Amer: 78 mL/min/{1.73_m2} (ref >59)
GLOBULIN, TOTAL: 2.4 g/dL (ref 1.5–4.5)
GLUCOSE: 138 mg/dL — AB (ref 65–99)
Potassium: 3.8 mmol/L (ref 3.5–5.2)
SODIUM: 142 mmol/L (ref 134–144)
Total Protein: 6.7 g/dL (ref 6.0–8.5)

## 2017-11-13 LAB — CBC WITH DIFFERENTIAL/PLATELET
BASOS ABS: 0 10*3/uL (ref 0.0–0.2)
Basos: 0 %
EOS (ABSOLUTE): 0.3 10*3/uL (ref 0.0–0.4)
Eos: 3 %
HEMATOCRIT: 44.1 % (ref 34.0–46.6)
HEMOGLOBIN: 15.3 g/dL (ref 11.1–15.9)
Immature Grans (Abs): 0 10*3/uL (ref 0.0–0.1)
Immature Granulocytes: 0 %
LYMPHS ABS: 2.8 10*3/uL (ref 0.7–3.1)
Lymphs: 32 %
MCH: 29.7 pg (ref 26.6–33.0)
MCHC: 34.7 g/dL (ref 31.5–35.7)
MCV: 86 fL (ref 79–97)
MONOS ABS: 0.6 10*3/uL (ref 0.1–0.9)
Monocytes: 6 %
NEUTROS ABS: 5.2 10*3/uL (ref 1.4–7.0)
Neutrophils: 59 %
Platelets: 243 10*3/uL (ref 150–450)
RBC: 5.16 x10E6/uL (ref 3.77–5.28)
RDW: 13.2 % (ref 12.3–15.4)
WBC: 8.9 10*3/uL (ref 3.4–10.8)

## 2017-11-13 LAB — VITAMIN D 25 HYDROXY (VIT D DEFICIENCY, FRACTURES): Vit D, 25-Hydroxy: 16.4 ng/mL — ABNORMAL LOW (ref 30.0–100.0)

## 2017-11-13 LAB — VITAMIN B12: Vitamin B-12: 462 pg/mL (ref 232–1245)

## 2017-11-13 LAB — T4, FREE: FREE T4: 1 ng/dL (ref 0.82–1.77)

## 2017-11-13 LAB — TSH: TSH: 2.04 u[IU]/mL (ref 0.450–4.500)

## 2017-11-14 ENCOUNTER — Other Ambulatory Visit: Payer: Self-pay | Admitting: Family Medicine

## 2017-11-14 ENCOUNTER — Encounter: Payer: Self-pay | Admitting: Family Medicine

## 2017-11-14 DIAGNOSIS — E559 Vitamin D deficiency, unspecified: Secondary | ICD-10-CM

## 2017-11-14 HISTORY — DX: Vitamin D deficiency, unspecified: E55.9

## 2017-11-14 MED ORDER — ATORVASTATIN CALCIUM 20 MG PO TABS
20.0000 mg | ORAL_TABLET | Freq: Every day | ORAL | 1 refills | Status: DC
Start: 2017-11-14 — End: 2017-12-23

## 2017-11-14 MED ORDER — VITAMIN D (ERGOCALCIFEROL) 1.25 MG (50000 UNIT) PO CAPS: 50000.0000 [IU] | ORAL_CAPSULE | ORAL | 0 refills | Status: DC

## 2017-12-22 ENCOUNTER — Telehealth: Payer: Self-pay | Admitting: Family Medicine

## 2017-12-22 ENCOUNTER — Encounter: Payer: Self-pay | Admitting: Family Medicine

## 2017-12-22 ENCOUNTER — Ambulatory Visit (INDEPENDENT_AMBULATORY_CARE_PROVIDER_SITE_OTHER): Payer: Commercial Managed Care - PPO | Admitting: Family Medicine

## 2017-12-22 VITALS — BP 120/70 | HR 65 | Wt 167.8 lb

## 2017-12-22 DIAGNOSIS — Z8669 Personal history of other diseases of the nervous system and sense organs: Secondary | ICD-10-CM

## 2017-12-22 DIAGNOSIS — R7401 Elevation of levels of liver transaminase levels: Secondary | ICD-10-CM

## 2017-12-22 DIAGNOSIS — G5601 Carpal tunnel syndrome, right upper limb: Secondary | ICD-10-CM

## 2017-12-22 DIAGNOSIS — E559 Vitamin D deficiency, unspecified: Secondary | ICD-10-CM

## 2017-12-22 DIAGNOSIS — R740 Nonspecific elevation of levels of transaminase and lactic acid dehydrogenase [LDH]: Secondary | ICD-10-CM | POA: Diagnosis not present

## 2017-12-22 DIAGNOSIS — E782 Mixed hyperlipidemia: Secondary | ICD-10-CM | POA: Diagnosis not present

## 2017-12-22 DIAGNOSIS — H547 Unspecified visual loss: Secondary | ICD-10-CM

## 2017-12-22 DIAGNOSIS — Z79899 Other long term (current) drug therapy: Secondary | ICD-10-CM

## 2017-12-22 NOTE — Progress Notes (Signed)
   Subjective:    Patient ID: Jill Vargas, female    DOB: [birth year], 52 y.o.   MRN: [id number]  HPI Chief Complaint  Patient presents with  . follow-up    follow-up on cholesterol.    She is here to follow up on mixed hyperlipidemia and triglycerides >400. Low HDL.  Started on statin. She has cut back on fried foods, fatty foods and meat in general.  She started walking for exercise.  Reports taking statin daily. No side effects.   Prediabetes.   Vitamin D deficiency and started on supplement.   ALT was mildly elevated. Does not drink alcohol.   Right hand with pain and some intermittent numbness/tingling at the base of her 3rd MCP joint and right wrist for the past several months.  States she has been using a splint on her hand and taking Advil. No relief. Feels like her 3rd finger is getting stuck sometimes. Would like to see a hand specialist.   States she has not heard from ophthalmologist as of yet for glaucoma and decreased vision.   Denies fever, chills, dizziness, chest pain, palpitations, shortness of breath, abdominal pain, N/V/D, urinary symptoms, LE edema.   Reviewed allergies, medications, past medical, surgical, family, and social history.    Review of Systems Pertinent positives and negatives in the history of present illness.     Objective:   Physical Exam BP 120/70   Pulse 65   Wt 167 lb 12.8 oz (76.1 kg)   BMI 30.20 kg/m   Alert and oriented and in no acute distress.  Upper extremities with equal grip strength. Sensation and motor function normal. Positive Tinel's and Phalen's right hand. Skin is warm and dry, no pallor or rash.      Assessment & Plan:  Mixed hyperlipidemia - Plan: Lipid panel  Vitamin D deficiency - Plan: VITAMIN D 25 Hydroxy (Vit-D Deficiency, Fractures)  Elevated ALT measurement - Plan: Comprehensive metabolic panel  Carpal tunnel syndrome on right - Plan: Ambulatory referral to Hand Surgery  Medication management -  Plan: VITAMIN D 25 Hydroxy (Vit-D Deficiency, Fractures), Lipid panel  History of glaucoma - Plan: Ambulatory referral to Ophthalmology  Decreased visual acuity - Plan: Ambulatory referral to Ophthalmology  Congratulated her on making healthy lifestyle changes with diet and exercise. Reports good compliance with statin and no side effects. Recheck lipid panel She is taking a vitamin D supplement and states she has more energy.  Recheck vitamin D level Referral to hand surgery for questionable CTS per patient request. No improvement with splint and NSAIDs.  Follow-up pending labs

## 2017-12-22 NOTE — Telephone Encounter (Signed)
While checking pt out she stated that she will need refills on Cholesterol medication. Please advise pt when labs come back,

## 2017-12-23 ENCOUNTER — Other Ambulatory Visit: Payer: Self-pay | Admitting: Family Medicine

## 2017-12-23 DIAGNOSIS — E782 Mixed hyperlipidemia: Secondary | ICD-10-CM

## 2017-12-23 LAB — VITAMIN D 25 HYDROXY (VIT D DEFICIENCY, FRACTURES): Vit D, 25-Hydroxy: 32.2 ng/mL (ref 30.0–100.0)

## 2017-12-23 LAB — COMPREHENSIVE METABOLIC PANEL
A/G RATIO: 2.1 (ref 1.2–2.2)
ALT: 30 IU/L (ref 0–32)
AST: 24 IU/L (ref 0–40)
Albumin: 4.5 g/dL (ref 3.5–5.5)
Alkaline Phosphatase: 99 IU/L (ref 39–117)
BILIRUBIN TOTAL: 0.4 mg/dL (ref 0.0–1.2)
BUN/Creatinine Ratio: 16 (ref 9–23)
BUN: 13 mg/dL (ref 6–24)
CHLORIDE: 104 mmol/L (ref 96–106)
CO2: 24 mmol/L (ref 20–29)
Calcium: 9.2 mg/dL (ref 8.7–10.2)
Creatinine, Ser: 0.81 mg/dL (ref 0.57–1.00)
GFR calc non Af Amer: 84 mL/min/{1.73_m2} (ref >59)
GFR, EST AFRICAN AMERICAN: 97 mL/min/{1.73_m2} (ref >59)
GLOBULIN, TOTAL: 2.1 g/dL (ref 1.5–4.5)
Glucose: 99 mg/dL (ref 65–99)
POTASSIUM: 4.3 mmol/L (ref 3.5–5.2)
SODIUM: 141 mmol/L (ref 134–144)
TOTAL PROTEIN: 6.6 g/dL (ref 6.0–8.5)

## 2017-12-23 LAB — LIPID PANEL
Chol/HDL Ratio: 4.9 ratio — ABNORMAL HIGH (ref 0.0–4.4)
Cholesterol, Total: 185 mg/dL (ref 100–199)
HDL: 38 mg/dL — ABNORMAL LOW (ref >39)
LDL Calculated: 95 mg/dL (ref 0–99)
TRIGLYCERIDES: 258 mg/dL — AB (ref 0–149)
VLDL Cholesterol Cal: 52 mg/dL — ABNORMAL HIGH (ref 5–40)

## 2017-12-23 MED ORDER — ATORVASTATIN CALCIUM 20 MG PO TABS: 20.0000 mg | ORAL_TABLET | Freq: Every day | ORAL | 5 refills | Status: DC

## 2018-08-06 ENCOUNTER — Telehealth: Payer: Self-pay | Admitting: Family Medicine

## 2018-08-06 MED ORDER — ATORVASTATIN CALCIUM 20 MG PO TABS: 20.0000 mg | ORAL_TABLET | Freq: Every day | ORAL | 5 refills | Status: DC

## 2018-08-06 NOTE — Telephone Encounter (Signed)
  Patient left voicemail that she needs refill on her cholesterol meds     Atorvastatin

## 2018-08-06 NOTE — Telephone Encounter (Signed)
done

## 2018-10-07 ENCOUNTER — Ambulatory Visit: Payer: Commercial Managed Care - PPO | Admitting: Family Medicine

## 2018-10-07 ENCOUNTER — Ambulatory Visit (INDEPENDENT_AMBULATORY_CARE_PROVIDER_SITE_OTHER): Payer: Commercial Managed Care - PPO | Admitting: Medical

## 2018-10-07 ENCOUNTER — Encounter: Payer: Self-pay | Admitting: Medical

## 2018-10-07 ENCOUNTER — Other Ambulatory Visit: Payer: Self-pay

## 2018-10-07 VITALS — BP 124/80 | HR 64 | Temp 98.9°F | Ht 62.0 in | Wt 171.0 lb

## 2018-10-07 DIAGNOSIS — M79652 Pain in left thigh: Secondary | ICD-10-CM | POA: Diagnosis not present

## 2018-10-07 NOTE — Patient Instructions (Signed)
Your exam findings today are just tenderness with exam, but there is no current swelling, redness, or color changes to your leg  I suspect that when your dog jumped up and scratched you you could have had a strain in the muscle and it may be all that is going home, and then you drive for 8 hours as well recently so you could just have muscle strain   Recommendations  Use over-the-counter ibuprofen 200 mg, 3 tablets twice daily for the next 5 days for pain and inflammation  When at home sit with a pillow under the leg to provide some elevation and comfort to the thigh  Use a heat pad or hot towel to the leg for 20 minutes at a time maybe twice daily for the next few days  Do some stretching for the next few days  I would like you to temporarily stop your cholesterol pill for the next week just to see if that calms down the pain.  Sometimes cholesterol medicines can cause pain or weakness in the thighs.  Typically this would be both sides but in the event this is causing some pain will stop it temporarily.  If you see any signs of color changes such as purple color, worsening red or pink coloration is then call us back  If you see any signs of swelling to the left thigh, such as the whole thigh looking more swollen than the right thigh call us immediately as this could be a sign of a blood clot  If your pain just continues to get worse call us back as well  If you develop a fever or the thigh feels red or hot, then call us back as well as this could be signs of infection

## 2018-10-07 NOTE — Progress Notes (Signed)
Subjective: Chief Complaint  Patient presents with  . Leg Pain    left leg/dog scatched 2 weeks ago   Here for left thigh pain, mostly anterior to medial thigh, started 2 weeks ago.   Her dog scratched her leg on her thigh 2 weeks ago.  She was worried about infection.  She notes that the scratch never was large or red or deep.  There is no skin findings today.  Her dog is a 52 pound [place] puppy about 45 months old.  She does not recall injuring herself when the dog jumped up on her.  So she does not think she strained her leg.  She does recall now that she did leave town and drove 8 hours for a funeral around the same time 2 weeks ago.  Currently she denies swelling in the leg, no discoloration of the leg.  She denies any recent trauma or surgery, no history of DVT no history of PE.  No family history of blood clots.  She is a non-smoker.  She is not on hormone therapy.  Her mother has a history of stroke.  No CP, no SOB, no DOE, no edema   She is not taking anything for the symptoms.  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  She denies any recent trauma any recent strenuous activity.  She is compliant with her statin medication for at least the last year  Past Medical History:  Diagnosis Date  . Chronic back pain    history related to cyst/fibroids  . Chronic pelvic pain in female   . Constipation    history  . Ectopic pregnancy    surgical removal  . Fibroid uterus   . Hydrosalpinx 10/2013   left  . Hyperlipemia    diet controlled - no meds  . SVD (spontaneous vaginal delivery)    x 3  . Vitamin D deficiency 11/14/2017   Current Outpatient Medications on File Prior to Visit  Medication Sig Dispense Refill  . atorvastatin (LIPITOR) 20 MG tablet Take 1 tablet (20 mg total) by mouth daily. 30 tablet 5   No current facility-administered medications on file prior to visit.    ROS as in subjective   Objective: BP 124/80    Pulse 64   Temp 98.9 F (37.2 C)   Ht 5\' 2"  (1.575 m)   Wt 171 lb (77.6 kg)   SpO2 97%   BMI 31.28 kg/m   Wt Readings from Last 3 Encounters:  10/07/18 171 lb (77.6 kg)  12/22/17 167 lb 12.8 oz (76.1 kg)  11/12/17 168 lb 3.2 oz (76.3 kg)   Gen: wd,wn, nad Skin: Unremarkable, no signs of abrasion, no erythema no discoloration She has some mild generalized tenderness of the left thigh but no obvious deformity no induration no fluctuance no warmth no asymmetry of the left thigh compared to the right.  She seemed to have some pain in the left thigh when she is squatting or bending the leg Otherwise legs are neurovascularly intact, no calf tenderness no calf deformity no swelling, negative Homans    Assessment: Encounter Diagnosis  Name Primary?  . Pain of left thigh Yes     Plan: It is not quite clear why she has pain in the left thigh.  2 weeks ago she did have a mild abrasion from her dog jumping up against her thigh so I suspect she had a strain of her thigh  and she was traveling a long distance in the car.  There is no obvious sign of infection or DVT today.  We discussed the following handout and recommendations:  Patient Instructions  Your exam findings today are just tenderness with exam, but there is no current swelling, redness, or color changes to your leg  I suspect that when your dog jumped up and scratched you you could have had a strain in the muscle and it may be all that is going home, and then you drive for 8 hours as well recently so you could just have muscle strain   Recommendations  Use over-the-counter ibuprofen 200 mg, 3 tablets twice daily for the next 5 days for pain and inflammation  When at home sit with a pillow under the leg to provide some elevation and comfort to the thigh  Use a heat pad or hot towel to the leg for 20 minutes at a time maybe twice daily for the next few days  Do some stretching for the next few days  I would like you to  temporarily stop your cholesterol pill for the next week just to see if that calms down the pain.  Sometimes cholesterol medicines can cause pain or weakness in the thighs.  Typically this would be both sides but in the event this is causing some pain will stop it temporarily.  If you see any signs of color changes such as purple color, worsening red or pink coloration is then call us back  If you see any signs of swelling to the left thigh, such as the whole thigh looking more swollen than the right thigh call us immediately as this could be a sign of a blood clot  If your pain just continues to get worse call us back as well  If you develop a fever or the thigh feels red or hot, then call us back as well as this could be signs of infection    Coline was seen today for leg pain.  Diagnoses and all orders for this visit:  Pain of left thigh

## 2018-12-21 ENCOUNTER — Ambulatory Visit (INDEPENDENT_AMBULATORY_CARE_PROVIDER_SITE_OTHER): Payer: Commercial Managed Care - PPO | Admitting: Family Medicine

## 2018-12-21 ENCOUNTER — Encounter: Payer: Self-pay | Admitting: Family Medicine

## 2018-12-21 ENCOUNTER — Other Ambulatory Visit: Payer: Self-pay

## 2018-12-21 VITALS — BP 124/78 | HR 74 | Temp 97.6°F | Wt 169.6 lb

## 2018-12-21 DIAGNOSIS — N3000 Acute cystitis without hematuria: Secondary | ICD-10-CM | POA: Diagnosis not present

## 2018-12-21 DIAGNOSIS — R350 Frequency of micturition: Secondary | ICD-10-CM

## 2018-12-21 DIAGNOSIS — R300 Dysuria: Secondary | ICD-10-CM

## 2018-12-21 DIAGNOSIS — Z113 Encounter for screening for infections with a predominantly sexual mode of transmission: Secondary | ICD-10-CM

## 2018-12-21 DIAGNOSIS — Z23 Encounter for immunization: Secondary | ICD-10-CM | POA: Diagnosis not present

## 2018-12-21 LAB — POCT URINALYSIS DIP (PROADVANTAGE DEVICE)
Bilirubin, UA: NEGATIVE
Blood, UA: NEGATIVE
Glucose, UA: NEGATIVE mg/dL
Ketones, POC UA: NEGATIVE mg/dL
Nitrite, UA: NEGATIVE
Protein Ur, POC: NEGATIVE mg/dL
Specific Gravity, Urine: 1.03
Urobilinogen, Ur: NEGATIVE
pH, UA: 6 (ref 5.0–8.0)

## 2018-12-21 MED ORDER — SULFAMETHOXAZOLE-TRIMETHOPRIM 800-160 MG PO TABS: 1.0000 | ORAL_TABLET | Freq: Two times a day (BID) | ORAL | 0 refills | Status: DC

## 2018-12-21 NOTE — Patient Instructions (Signed)
Increase your water intake and start the antibiotic today.   You may try taking over the counter AZO to help with the pain for 1-2 days and then stop.   We will contact you with your lab results.

## 2018-12-21 NOTE — Progress Notes (Signed)
Subjective: Chief Complaint  Patient presents with  . possible UTI    Possible UTI, frequency burning, since the weekend     Jill Vargas is a 53 y.o. female who complains of possible urinary tract infection.  She has had symptoms for 4 daysSymptoms include dysuria, urinray frequency, pelvic pressure, and low back pain. Patient denies fever, chills, N/V/D.  Last UTI was unknown.   Using nothing for current symptoms.    Patient does not have a history of recurrent UTI. Patient does not have a history of pyelonephritis.  No other aggravating or relieving factors.  No other c/o.  States her husband is a Administrator and would like to be tested for STDs.  No periods since ablation.  Has OB/GYN    Past Medical History:  Diagnosis Date  . Chronic back pain    history related to cyst/fibroids  . Chronic pelvic pain in female   . Constipation    history  . Ectopic pregnancy    surgical removal  . Fibroid uterus   . Hydrosalpinx 10/2013   left  . Hyperlipemia    diet controlled - no meds  . SVD (spontaneous vaginal delivery)    x 3  . Vitamin D deficiency 11/14/2017    ROS as in subjective  Reviewed allergies, medications, past medical, surgical, and social history.    Objective: Vitals:   12/21/18 1341  BP: 124/78  Pulse: 74  Temp: 97.6 F (36.4 C)    General appearance: alert, no distress, WD/WN, female Abdomen: +bs, soft, suprapubic tenderness but non tender otherwise, non distended, no masses, no hepatomegaly, no splenomegaly, no bruits Back: no CVA tenderness GU: declines      Laboratory:  Urine dipstick: trace for leukocyte esterase.       Assessment: Acute cystitis without hematuria - Plan: Urine Culture, sulfamethoxazole-trimethoprim (BACTRIM DS) 800-160 MG tablet  Burning with urination - Plan: POCT Urinalysis DIP (Proadvantage Device), Urine Culture  Frequent urination - Plan: POCT Urinalysis DIP (Proadvantage Device)  Needs flu shot - Plan: Flu  Vaccine QUAD 36+ mos IM  Screen for STD (sexually transmitted disease) - Plan: RPR, HIV Antibody (routine testing w rflx), GC/Chlamydia Probe Amp, Trichomonas vaginalis, RNA    Plan: Discussed symptoms, diagnosis, possible complications, and usual course of illness.  Begin antibiotic and increase water intake, can use OTC Tylenol for pain.    Advised she may take OTC AZO for 1-2 days for pain and then stop.     Urine culture sent.  STD testing done. Follow up pending labs.   Call or return if worse or not improving.

## 2018-12-22 LAB — TRICHOMONAS VAGINALIS, PROBE AMP: Trich vag by NAA: NEGATIVE

## 2018-12-22 LAB — RPR: RPR Ser Ql: NONREACTIVE

## 2018-12-22 LAB — URINE CULTURE

## 2018-12-22 LAB — GC/CHLAMYDIA PROBE AMP
Chlamydia trachomatis, NAA: NEGATIVE
Neisseria Gonorrhoeae by PCR: NEGATIVE

## 2018-12-22 LAB — HIV ANTIBODY (ROUTINE TESTING W REFLEX): HIV Screen 4th Generation wRfx: NONREACTIVE

## 2019-01-28 ENCOUNTER — Ambulatory Visit: Payer: Commercial Managed Care - PPO | Admitting: Family Medicine

## 2019-02-03 ENCOUNTER — Other Ambulatory Visit: Payer: Self-pay

## 2019-02-03 ENCOUNTER — Ambulatory Visit (INDEPENDENT_AMBULATORY_CARE_PROVIDER_SITE_OTHER): Payer: Commercial Managed Care - PPO | Admitting: Family Medicine

## 2019-02-03 ENCOUNTER — Encounter: Payer: Self-pay | Admitting: Family Medicine

## 2019-02-03 VITALS — BP 120/82 | HR 75 | Temp 97.9°F | Wt 170.8 lb

## 2019-02-03 DIAGNOSIS — N941 Unspecified dyspareunia: Secondary | ICD-10-CM | POA: Diagnosis not present

## 2019-02-03 DIAGNOSIS — N760 Acute vaginitis: Secondary | ICD-10-CM | POA: Diagnosis not present

## 2019-02-03 DIAGNOSIS — Z87440 Personal history of urinary (tract) infections: Secondary | ICD-10-CM

## 2019-02-03 DIAGNOSIS — N898 Other specified noninflammatory disorders of vagina: Secondary | ICD-10-CM

## 2019-02-03 DIAGNOSIS — B9689 Other specified bacterial agents as the cause of diseases classified elsewhere: Secondary | ICD-10-CM

## 2019-02-03 DIAGNOSIS — B373 Candidiasis of vulva and vagina: Secondary | ICD-10-CM

## 2019-02-03 DIAGNOSIS — B3731 Acute candidiasis of vulva and vagina: Secondary | ICD-10-CM

## 2019-02-03 DIAGNOSIS — R300 Dysuria: Secondary | ICD-10-CM | POA: Diagnosis not present

## 2019-02-03 DIAGNOSIS — Z111 Encounter for screening for respiratory tuberculosis: Secondary | ICD-10-CM

## 2019-02-03 LAB — POCT URINALYSIS DIP (PROADVANTAGE DEVICE)
Bilirubin, UA: NEGATIVE
Blood, UA: NEGATIVE
Glucose, UA: NEGATIVE mg/dL
Ketones, POC UA: NEGATIVE mg/dL
Leukocytes, UA: NEGATIVE
Nitrite, UA: NEGATIVE
Protein Ur, POC: NEGATIVE mg/dL
Specific Gravity, Urine: 1.025
Urobilinogen, Ur: NEGATIVE
pH, UA: 6 (ref 5.0–8.0)

## 2019-02-03 LAB — POCT WET PREP (WET MOUNT): Trichomonas Wet Prep HPF POC: ABSENT

## 2019-02-03 MED ORDER — FLUCONAZOLE 150 MG PO TABS: 150.0000 mg | ORAL_TABLET | Freq: Once | ORAL | 0 refills | Status: AC

## 2019-02-03 MED ORDER — METRONIDAZOLE 500 MG PO TABS: 500.0000 mg | ORAL_TABLET | Freq: Two times a day (BID) | ORAL | 0 refills | Status: DC

## 2019-02-03 NOTE — Patient Instructions (Signed)
Avoid alcohol with metronidazole.   Let me know if you are not back to baseline after completing all medication. Let me know if you are getting worse or have new symptoms.

## 2019-02-03 NOTE — Progress Notes (Signed)
Subjective:    Patient ID: Jill Vargas, female    DOB: 1966/02/27, 53 y.o.   MRN: [id number]  HPI Chief Complaint  Patient presents with  . painful with urination    painful with urination. was on meds and once finished her symptoms came back. burning, frequency, painful. needs quanferon gold test   Here with complaints of urinary frequency, urgency and dysuria. This has been a recurrent issue.   States symptoms improved 50% after taking a course of Bactrim in late September but the symptoms returned 2 weeks ago.   She also reports having vaginal irritation, odor and itching. These improved with monistat but worsening again.   She also reports pain with intercourse and lower abdominal pressure. No vaginal discharge.   Denies fever, chills, dizziness, chest pain, palpitations, shortness of breath, cough,  N/V/D, LE edema.    States she needs screening TB test for work.     Review of Systems Pertinent positives and negatives in the history of present illness.     Objective:   Physical Exam Exam conducted with a chaperone present.  Constitutional:      General: She is not in acute distress.    Appearance: Normal appearance. She is not ill-appearing.  Eyes:     Conjunctiva/sclera: Conjunctivae normal.     Pupils: Pupils are equal, round, and reactive to light.  Cardiovascular:     Rate and Rhythm: Normal rate and regular rhythm.     Pulses: Normal pulses.  Pulmonary:     Effort: Pulmonary effort is normal.     Breath sounds: Normal breath sounds.  Abdominal:     General: Abdomen is flat. Bowel sounds are normal. There is no distension.     Palpations: Abdomen is soft.     Tenderness: There is abdominal tenderness in the suprapubic area. There is no right CVA tenderness, left CVA tenderness, guarding or rebound. Negative signs include Murphy's sign, Rovsing's sign, McBurney's sign and psoas sign.  Genitourinary:    General: Normal vulva.     Labia:        Right: No  rash, tenderness or lesion.        Left: No rash, tenderness or lesion.      Vagina: Erythema present. No tenderness.     Cervix: Normal.     Uterus: Normal.      Adnexa: Right adnexa normal and left adnexa normal.  Skin:    General: Skin is warm and dry.     Capillary Refill: Capillary refill takes less than 2 seconds.  Neurological:     General: No focal deficit present.     Mental Status: She is alert and oriented to person, place, and time.    BP 120/82   Pulse 75   Temp 97.9 F (36.6 C) (Oral)   Wt 170 lb 12.8 oz (77.5 kg)   BMI 31.24 kg/m       Assessment & Plan:  BV (bacterial vaginosis) - Plan: metroNIDAZOLE (FLAGYL) 500 MG tablet Wet prep +clue cells. Avoid alcohol with antibiotic   Candida vaginitis - Plan: fluconazole (DIFLUCAN) 150 MG tablet +yeast but only a few. Will treat with Diflucan and she may repeat if needed after completing the antibiotic   Dysuria - Plan: POCT Urinalysis DIP (Proadvantage Device), Urine culture -UA negative but I will culture urine. Consider referral to urology if culture is negative and symptoms persist.   Vaginal irritation - Plan: POCT Wet Prep [facility]), NuSwab Vaginitis Plus (VG+) -treat  for BV and yeast. NuSwab cancelled due to expense. No new sexual partners and negative for STDs at previous visit.   Dyspareunia in female - Plan: POCT Wet Prep [facility]), NuSwab Vaginitis Plus (VG+) -consider [place] or referral to gynecology if symptoms do not resolve with current treatment.   Vaginal itching - Plan: POCT Wet Prep ([place]et [place]), NuSwab Vaginitis Plus (VG+)  History of UTI - Plan: Urine culture  Screening-pulmonary TB - Plan: QuantiFERON-TB Gold Plus -done per patient request for her employer.

## 2019-02-04 LAB — URINE CULTURE: Organism ID, Bacteria: NO GROWTH

## 2019-02-06 LAB — QUANTIFERON-TB GOLD PLUS
QuantiFERON Mitogen Value: >10 IU/mL
QuantiFERON Nil Value: 0.01 IU/mL
QuantiFERON TB1 Ag Value: 0.02 IU/mL
QuantiFERON TB2 Ag Value: 0.01 IU/mL
QuantiFERON-TB Gold Plus: NEGATIVE

## 2019-02-16 DIAGNOSIS — H524 Presbyopia: Secondary | ICD-10-CM | POA: Diagnosis not present

## 2019-02-16 DIAGNOSIS — Z135 Encounter for screening for eye and ear disorders: Secondary | ICD-10-CM | POA: Diagnosis not present

## 2019-02-16 DIAGNOSIS — H04123 Dry eye syndrome of bilateral lacrimal glands: Secondary | ICD-10-CM | POA: Diagnosis not present

## 2019-02-16 DIAGNOSIS — H52223 Regular astigmatism, bilateral: Secondary | ICD-10-CM | POA: Diagnosis not present

## 2019-02-16 IMAGING — CT CT ABD-PELV W/ CM
2 of 5 series · 17 of 46 positions shown, 19 images · IV contrast (APPLIED)
Comparison: Ultrasound 09/07/2017, CT 02/13/2016

CLINICAL DATA: Epigastric pain

EXAM:
CT ABDOMEN AND PELVIS WITH CONTRAST
TECHNIQUE: Multidetector CT imaging of the abdomen and pelvis was performed
using the standard protocol following bolus administration of
intravenous contrast.
CONTRAST:  100mL OMNIPAQUE IOHEXOL 300 MG/ML  SOLN

[Series 3: abd/ pelvis 5.0 i30f 2 · axial · 0.93mm/px · z∈[+613,+1088]mm · 14 of 106 slices shown, 16 images]
[im 6/106  soft-tissue]
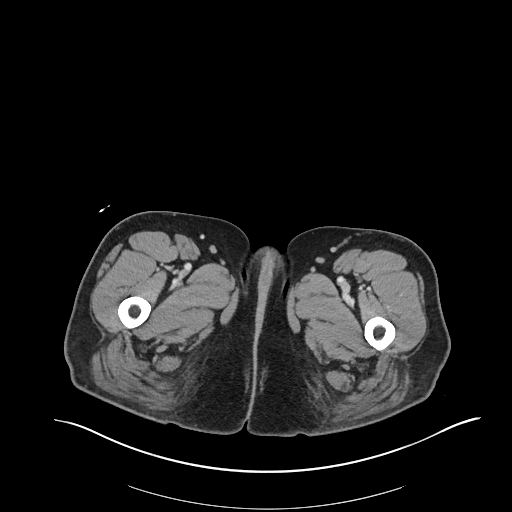
[im 6/106  bone]
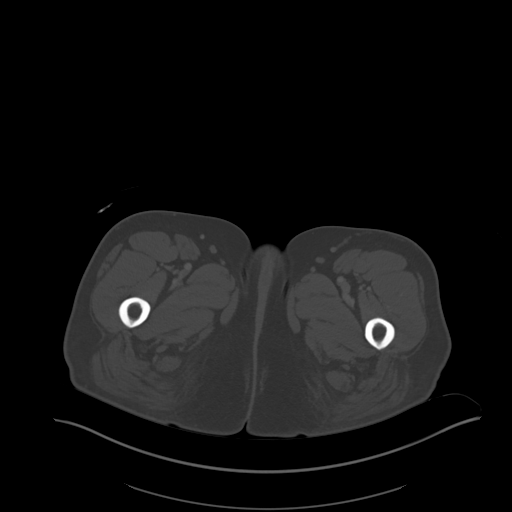
[im 16/106  soft-tissue]
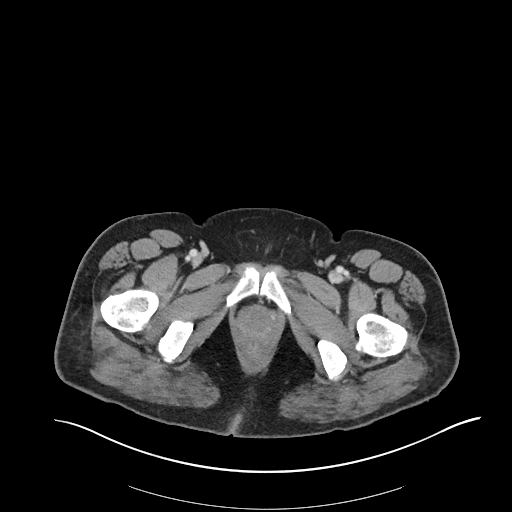
[im 21/106  soft-tissue]
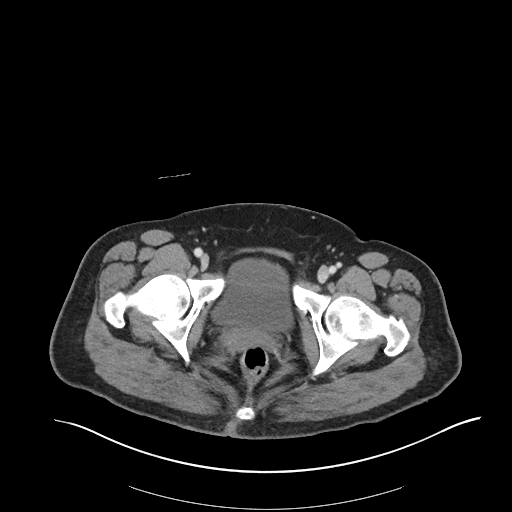
[im 31/106  soft-tissue]
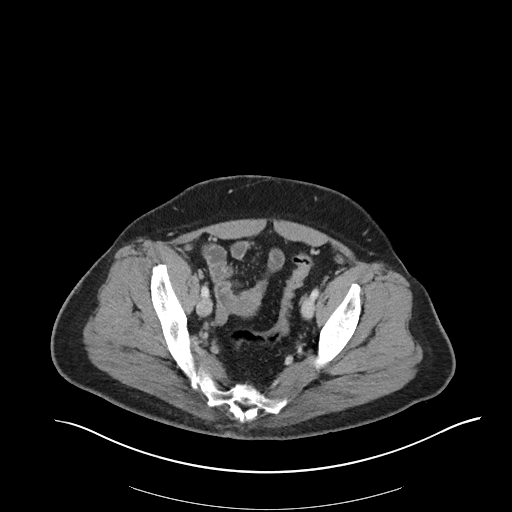
[im 36/106  soft-tissue]
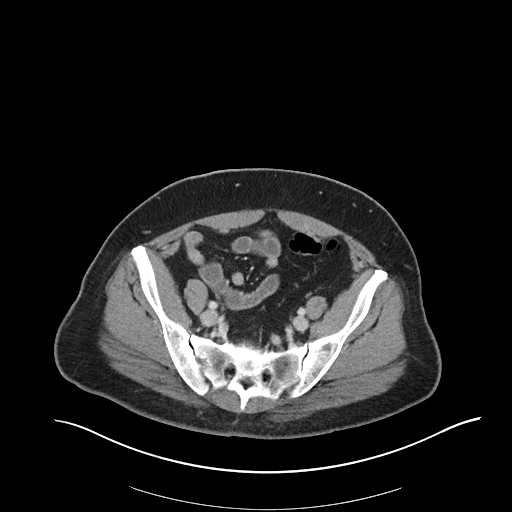
[im 41/106  soft-tissue]
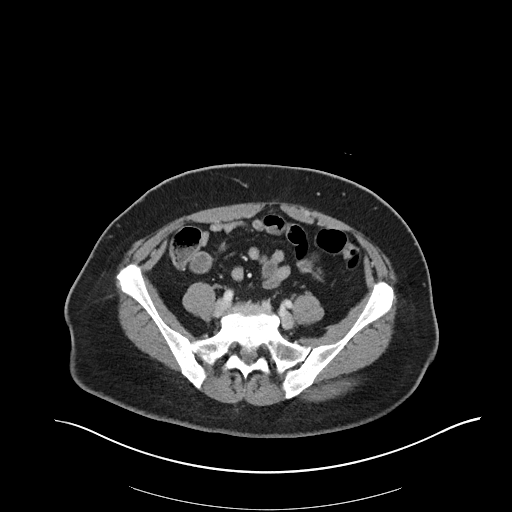
[im 51/106  soft-tissue]
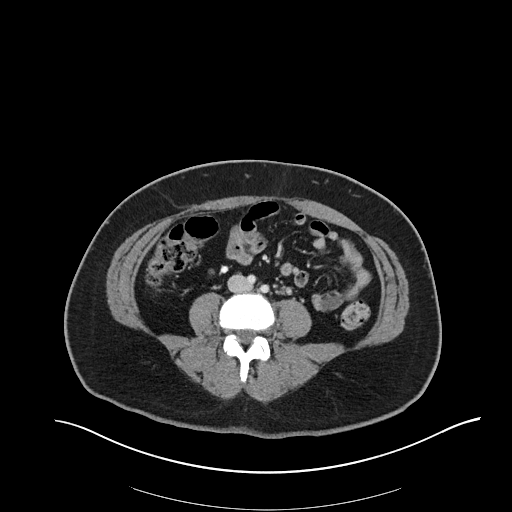
[im 56/106  soft-tissue]
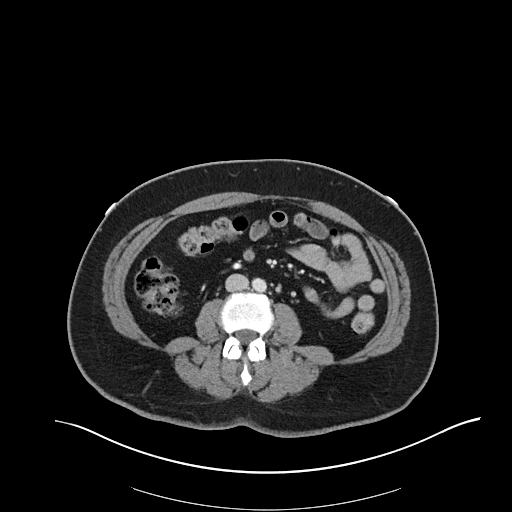
[im 66/106  soft-tissue]
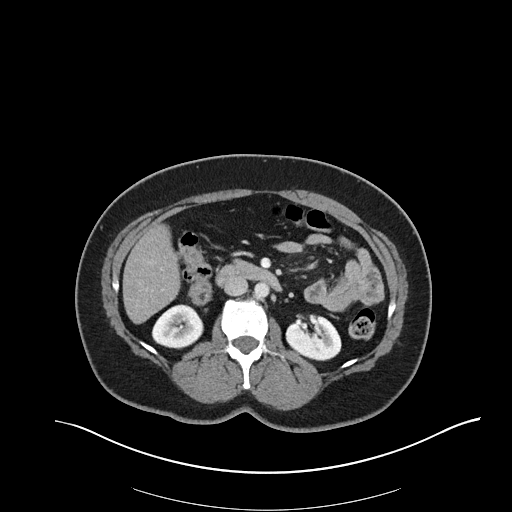
[im 66/106  bone]
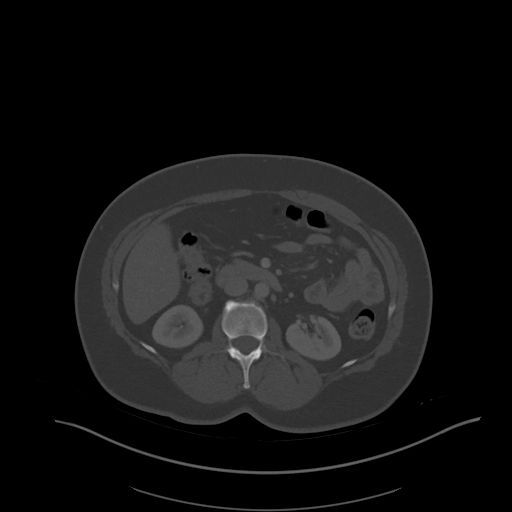
[im 71/106  soft-tissue]
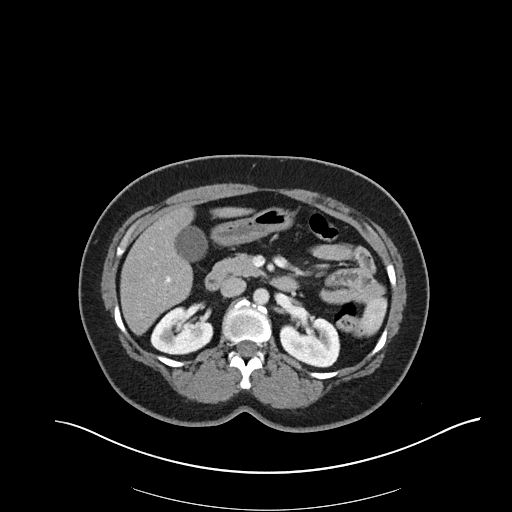
[im 81/106  soft-tissue]
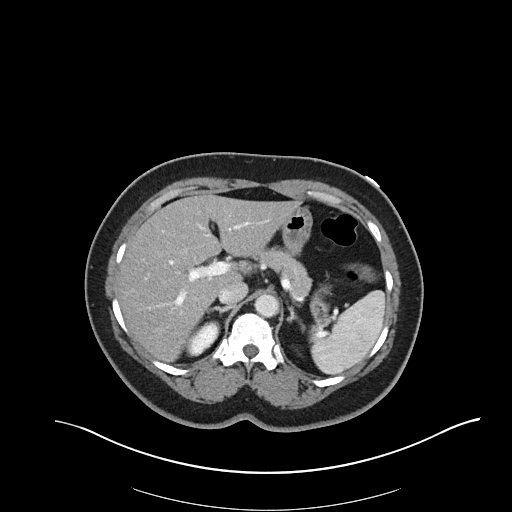
[im 86/106  soft-tissue]
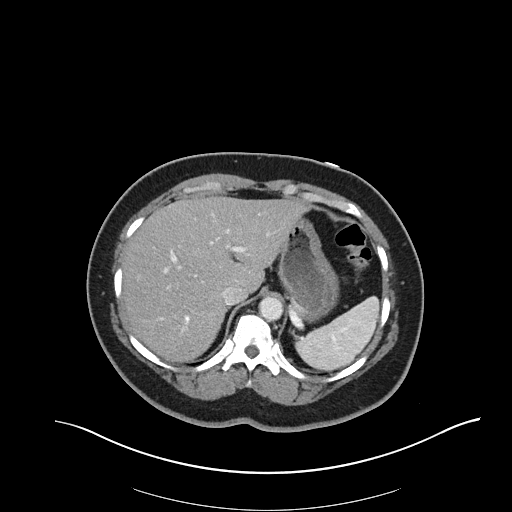
[im 91/106  soft-tissue]
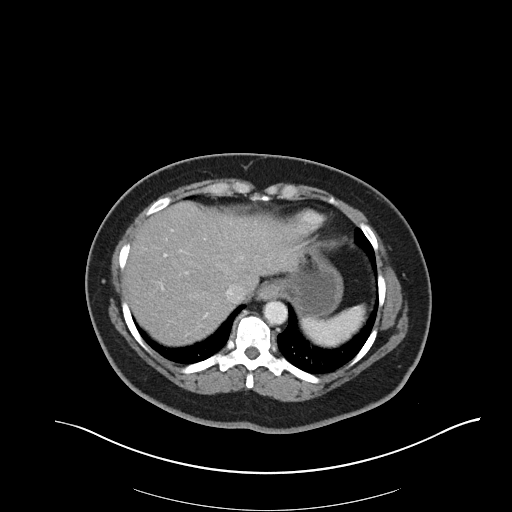
[im 101/106  soft-tissue]
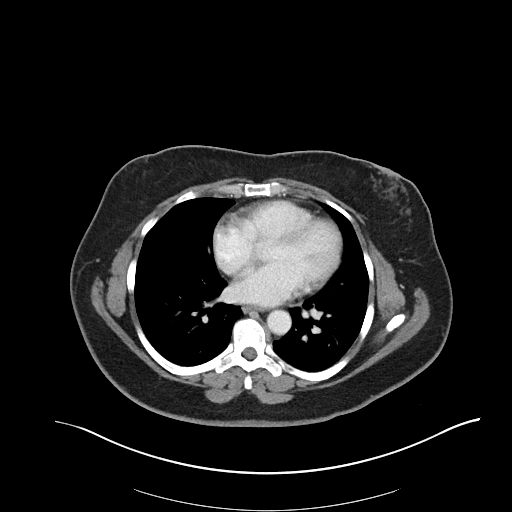

[Series 6: coronal soft tissue · coronal · 0.92mm/px · 3 of 100 slices shown]
[im 34/100  soft-tissue]
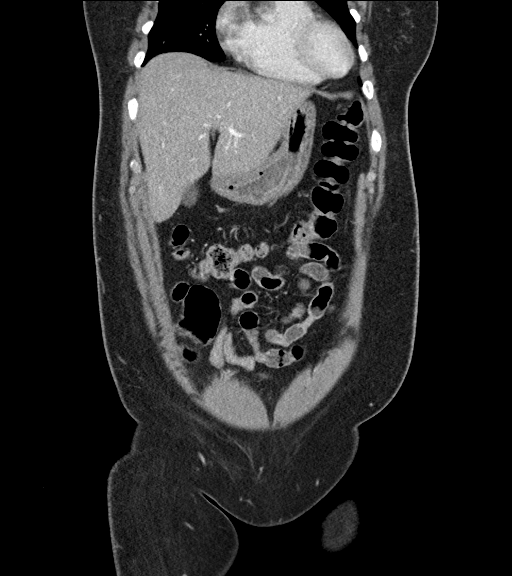
[im 45/100  soft-tissue]
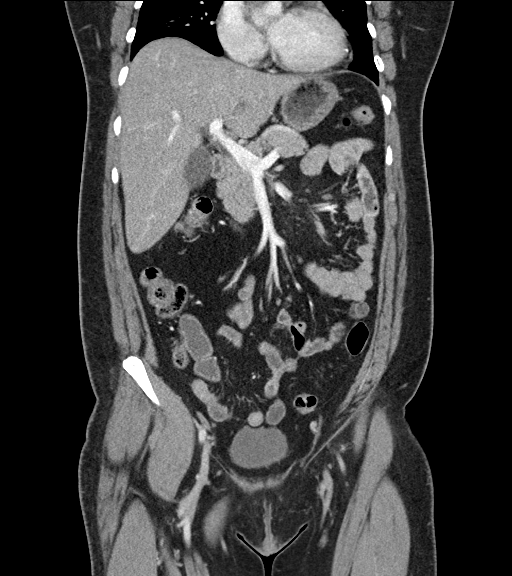
[im 56/100  soft-tissue]
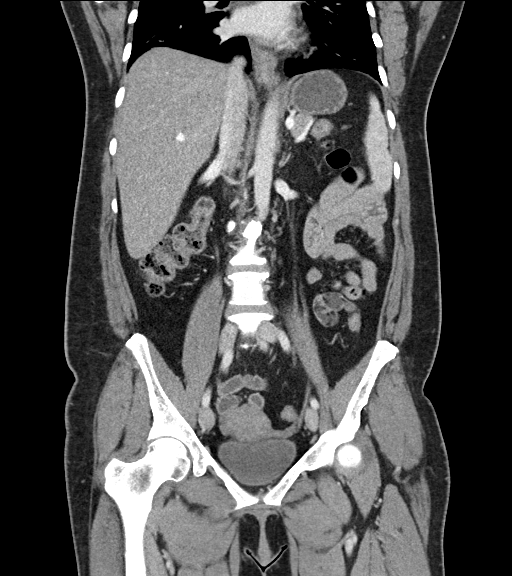

[17 of 46 positions shown; findings below may reference images not displayed]

FINDINGS: Lower chest: No acute abnormality.

Hepatobiliary: No focal liver abnormality is seen. No gallstones,
gallbladder wall thickening, or biliary dilatation.

Pancreas: Unremarkable. No pancreatic ductal dilatation or
surrounding inflammatory changes.

Spleen: Normal in size without focal abnormality.

Adrenals/Urinary Tract: Adrenal glands are unremarkable. Kidneys are
normal, without renal calculi, focal lesion, or hydronephrosis.
Bladder is unremarkable.

Stomach/Bowel: Stomach is within normal limits. Status post
appendectomy. No evidence of bowel wall thickening, distention, or
inflammatory changes.

Vascular/Lymphatic: No significant vascular findings are present. No
enlarged abdominal or pelvic lymph nodes.

Reproductive: Uterus and bilateral adnexa are unremarkable.

Other: Negative for free air or free fluid.

Musculoskeletal: No acute or significant osseous findings.
IMPRESSION: No CT evidence for acute intra-abdominal or pelvic abnormality

## 2019-04-03 DIAGNOSIS — Z209 Contact with and (suspected) exposure to unspecified communicable disease: Secondary | ICD-10-CM | POA: Diagnosis not present

## 2019-05-01 ENCOUNTER — Emergency Department (HOSPITAL_COMMUNITY)
Admission: EM | Admit: 2019-05-01 | Discharge: 2019-05-01 | Disposition: A | Discharge status: Home / Self Care | Payer: Commercial Managed Care - PPO | Attending: Emergency Medicine | Admitting: Emergency Medicine

## 2019-05-01 ENCOUNTER — Emergency Department (HOSPITAL_COMMUNITY): Payer: Commercial Managed Care - PPO

## 2019-05-01 ENCOUNTER — Encounter: Payer: Self-pay | Admitting: Emergency Medicine

## 2019-05-01 DIAGNOSIS — R358 Other polyuria: Secondary | ICD-10-CM | POA: Insufficient documentation

## 2019-05-01 DIAGNOSIS — R110 Nausea: Secondary | ICD-10-CM | POA: Insufficient documentation

## 2019-05-01 DIAGNOSIS — R631 Polydipsia: Secondary | ICD-10-CM

## 2019-05-01 DIAGNOSIS — R3589 Other polyuria: Secondary | ICD-10-CM

## 2019-05-01 DIAGNOSIS — R1031 Right lower quadrant pain: Secondary | ICD-10-CM | POA: Diagnosis not present

## 2019-05-01 DIAGNOSIS — R7309 Other abnormal glucose: Secondary | ICD-10-CM | POA: Diagnosis not present

## 2019-05-01 DIAGNOSIS — R350 Frequency of micturition: Secondary | ICD-10-CM | POA: Diagnosis present

## 2019-05-01 HISTORY — DX: Pure hypercholesterolemia, unspecified: E78.00

## 2019-05-01 LAB — COMPREHENSIVE METABOLIC PANEL
ALT: 35 U/L (ref 0–44)
AST: 23 U/L (ref 15–41)
Albumin: 3.9 g/dL (ref 3.5–5.0)
Alkaline Phosphatase: 79 U/L (ref 38–126)
Anion gap: 11 (ref 5–15)
BUN: 9 mg/dL (ref 6–20)
CO2: 22 mmol/L (ref 22–32)
Calcium: 9.1 mg/dL (ref 8.9–10.3)
Chloride: 106 mmol/L (ref 98–111)
Creatinine, Ser: 0.7 mg/dL (ref 0.44–1.00)
GFR calc Af Amer: >60 mL/min (ref >60)
GFR calc non Af Amer: >60 mL/min (ref >60)
Glucose, Bld: 100 mg/dL — ABNORMAL HIGH (ref 70–99)
Potassium: 3.8 mmol/L (ref 3.5–5.1)
Sodium: 139 mmol/L (ref 135–145)
Total Bilirubin: 0.7 mg/dL (ref 0.3–1.2)
Total Protein: 6.4 g/dL — ABNORMAL LOW (ref 6.5–8.1)

## 2019-05-01 LAB — URINALYSIS, ROUTINE W REFLEX MICROSCOPIC
Bilirubin Urine: NEGATIVE
Glucose, UA: 50 mg/dL — AB
Hgb urine dipstick: NEGATIVE
Ketones, ur: NEGATIVE mg/dL
Nitrite: NEGATIVE
Protein, ur: NEGATIVE mg/dL
Specific Gravity, Urine: 1.021 (ref 1.005–1.030)
pH: 6 (ref 5.0–8.0)

## 2019-05-01 LAB — CBC WITH DIFFERENTIAL/PLATELET
Abs Immature Granulocytes: 0.04 10*3/uL (ref 0.00–0.07)
Basophils Absolute: 0 10*3/uL (ref 0.0–0.1)
Basophils Relative: 1 %
Eosinophils Absolute: 0.2 10*3/uL (ref 0.0–0.5)
Eosinophils Relative: 3 %
HCT: 44.6 % (ref 36.0–46.0)
Hemoglobin: 14.6 g/dL (ref 12.0–15.0)
Immature Granulocytes: 1 %
Lymphocytes Relative: 38 %
Lymphs Abs: 2.6 10*3/uL (ref 0.7–4.0)
MCH: 28.9 pg (ref 26.0–34.0)
MCHC: 32.7 g/dL (ref 30.0–36.0)
MCV: 88.3 fL (ref 80.0–100.0)
Monocytes Absolute: 0.7 10*3/uL (ref 0.1–1.0)
Monocytes Relative: 10 %
Neutro Abs: 3.4 10*3/uL (ref 1.7–7.7)
Neutrophils Relative %: 47 %
Platelets: 245 10*3/uL (ref 150–400)
RBC: 5.05 MIL/uL (ref 3.87–5.11)
RDW: 12.3 % (ref 11.5–15.5)
WBC: 7 10*3/uL (ref 4.0–10.5)
nRBC: 0 % (ref 0.0–0.2)

## 2019-05-01 LAB — LIPASE, BLOOD: Lipase: 65 U/L — ABNORMAL HIGH (ref 11–51)

## 2019-05-01 LAB — HEMOGLOBIN A1C
Hgb A1c MFr Bld: 6 % — ABNORMAL HIGH (ref 4.8–5.6)
Mean Plasma Glucose: 125.5 mg/dL

## 2019-05-01 MED ORDER — IOHEXOL 300 MG/ML  SOLN
100.0000 mL | Freq: Once | INTRAMUSCULAR | Status: AC | PRN
  Administered 2019-05-01: 100 mL via INTRAVENOUS

## 2019-05-01 MED ORDER — SODIUM CHLORIDE 0.9 % IV BOLUS
1000.0000 mL | Freq: Once | INTRAVENOUS | Status: AC
  Administered 2019-05-01: 1000 mL via INTRAVENOUS

## 2019-05-01 MED ORDER — ONDANSETRON HCL 4 MG/2ML IJ SOLN
4.0000 mg | Freq: Once | INTRAMUSCULAR | Status: AC
  Administered 2019-05-01: 4 mg via INTRAVENOUS
  Filled 2019-05-01: qty 2

## 2019-05-01 MED ORDER — MORPHINE SULFATE (PF) 4 MG/ML IV SOLN
4.0000 mg | Freq: Once | INTRAVENOUS | Status: AC
  Administered 2019-05-01: 4 mg via INTRAVENOUS
  Filled 2019-05-01: qty 1

## 2019-05-01 MED ORDER — ONDANSETRON HCL 4 MG PO TABS: 4.0000 mg | ORAL_TABLET | Freq: Four times a day (QID) | ORAL | 0 refills | Status: DC

## 2019-05-01 NOTE — ED Notes (Signed)
Ask patient for urine sample, Patient stated she did not need to urinate at the time.

## 2019-05-01 NOTE — ED Triage Notes (Signed)
Pt. Stated, Jill Vargas had urinating a lot, and have pain in my back started a week ago.

## 2019-05-01 NOTE — ED Provider Notes (Signed)
[facility] EMERGENCY DEPARTMENT Provider Note   CSN: [id number] Arrival date & time: 05/01/19  1052     History Chief Complaint  Patient presents with  . Urinary Frequency    Jill Vargas is a 54 y.o. female presenting for evaluation of urinary frequency, polydipsia, and right lower abdominal discomfort.  Patient states for the past week, she has had persistent urinary frequency and polydipsia.  Yesterday she developed right lower abdominal pain which has moved to her back.  Pain is described as a constant sharp stabbing pain.  Reports associated nausea without vomiting.  Patient states her hands feel swollen, but no swelling of her legs.  She denies fevers, chills, chest pain, shortness of breath, cough.  Patient states she is having frequent bowel movements, but no diarrhea.  She denies dysuria, hematuria, or vaginal discharge.  Patient states pain improves with urination but promptly returns.  No change with BMs.  She has been drinking cranberry juice without improvement of symptoms.  She has taken Tylenol without significant improvement.  Patient states she has high cholesterol for which she takes a statin, no other medical problems.  She is prediabetic, but is not on medication.  Previous history of an appendectomy.  HPI     Past Medical History:  Diagnosis Date  . High cholesterol     There are no problems to display for this patient.   History reviewed. No pertinent surgical history.   OB History   No obstetric history on file.     No family history on file.  Social History   Tobacco Use  . Smoking status: Never Smoker  . Smokeless tobacco: Never Used  Substance Use Topics  . Alcohol use: Not Currently  . Drug use: Not Currently    Home Medications Prior to Admission medications   Medication Sig Start Date End Date Taking? Authorizing Provider  ondansetron (ZOFRAN) 4 MG tablet Take 1 tablet (4 mg total) by mouth every 6 (six) hours. 05/01/19    Lynett Brasil, PA-C    Allergies    Patient has no allergy information on record.  Review of Systems   Review of Systems  Gastrointestinal: Positive for abdominal pain and nausea.  Endocrine: Positive for polydipsia and polyuria.  Genitourinary: Positive for frequency. Negative for dysuria, hematuria, pelvic pain, vaginal bleeding, vaginal discharge and vaginal pain.  All other systems reviewed and are negative.   Physical Exam Updated Vital Signs BP 117/75   Pulse (!) 55   Temp 97.6 F (36.4 C) (Oral)   Resp 16   Ht 5\' 2"  (1.575 m)   Wt 75.8 kg   SpO2 92%   BMI 30.54 kg/m   Physical Exam Vitals and nursing note reviewed.  Constitutional:      General: She is not in acute distress.    Appearance: She is well-developed.     Comments: Sitting comfortably in the bed in no acute distress  HENT:     Head: Normocephalic and atraumatic.  Eyes:     Conjunctiva/sclera: Conjunctivae normal.     Pupils: Pupils are equal, round, and reactive to light.  Cardiovascular:     Rate and Rhythm: Normal rate and regular rhythm.     Pulses: Normal pulses.  Pulmonary:     Effort: Pulmonary effort is normal. No respiratory distress.     Breath sounds: Normal breath sounds. No wheezing.  Abdominal:     General: There is no distension.     Palpations: Abdomen is soft.  There is no mass.     Tenderness: There is abdominal tenderness. There is right CVA tenderness. There is no guarding or rebound.     Comments: Mild tenderness to palpation of right lower quadrant.  Mild discomfort with palpation of right-sided back/CVA.  No rigidity, guarding, distention.  Negative rebound.  No peritonitis.  Negative Murphy's.  Musculoskeletal:        General: Normal range of motion.     Cervical back: Normal range of motion and neck supple.     Comments: ?new swelling of bilateral hands. No pitting edema. unknown if new.   Skin:    General: Skin is warm and dry.     Capillary Refill: Capillary  refill takes less than 2 seconds.  Neurological:     Mental Status: She is alert and oriented to person, place, and time.     ED Results / Procedures / Treatments   Labs (all labs ordered are listed, but only abnormal results are displayed) Labs Reviewed  URINALYSIS, ROUTINE W REFLEX MICROSCOPIC - Abnormal; Notable for the following components:      Result Value   APPearance HAZY (*)    Glucose, UA 50 (*)    Leukocytes,Ua MODERATE (*)    Bacteria, UA FEW (*)    All other components within normal limits  COMPREHENSIVE METABOLIC PANEL - Abnormal; Notable for the following components:   Glucose, Bld 100 (*)    Total Protein 6.4 (*)    All other components within normal limits  LIPASE, BLOOD - Abnormal; Notable for the following components:   Lipase 65 (*)    All other components within normal limits  HEMOGLOBIN A1C - Abnormal; Notable for the following components:   Hgb A1c MFr Bld 6.0 (*)    All other components within normal limits  URINE CULTURE  CBC WITH DIFFERENTIAL/PLATELET    EKG None  Radiology CT ABDOMEN PELVIS W CONTRAST  Result Date: 05/01/2019 CLINICAL DATA:  Right lower quadrant abdominal pain. EXAM: CT ABDOMEN AND PELVIS WITH CONTRAST TECHNIQUE: Multidetector CT imaging of the abdomen and pelvis was performed using the standard protocol following bolus administration of intravenous contrast. CONTRAST:  191mL OMNIPAQUE IOHEXOL 300 MG/ML  SOLN COMPARISON:  None. FINDINGS: Lower chest: No acute abnormality. Hepatobiliary: No focal liver abnormality is seen. No gallstones, gallbladder wall thickening, or biliary dilatation. Pancreas: Unremarkable. No pancreatic ductal dilatation or surrounding inflammatory changes. Spleen: Normal in size without focal abnormality. Adrenals/Urinary Tract: Adrenal glands are unremarkable. Kidneys are normal, without renal calculi, focal lesion, or hydronephrosis. Bladder is unremarkable. Stomach/Bowel: The stomach appears normal. There is no  evidence of bowel obstruction or inflammation. The appendix is not visualized, but no inflammation is noted in the right lower quadrant. Potentially the patient may be status post appendectomy. Vascular/Lymphatic: No significant vascular findings are present. No enlarged abdominal or pelvic lymph nodes. Reproductive: Uterus and bilateral adnexa are unremarkable. Other: No abdominal wall hernia or abnormality. No abdominopelvic ascites. Musculoskeletal: No acute or significant osseous findings. IMPRESSION: No definite abnormality seen in the abdomen or pelvis. Electronically Signed   By: Marijo Conception M.D.   On: 05/01/2019 15:00    Procedures Procedures (including critical care time)  Medications Ordered in ED Medications  ondansetron (ZOFRAN) injection 4 mg (4 mg Intravenous Given 05/01/19 1417)  sodium chloride 0.9 % bolus 1,000 mL (0 mLs Intravenous Stopped 05/01/19 1621)  morphine 4 MG/ML injection 4 mg (4 mg Intravenous Given 05/01/19 1417)  iohexol (OMNIPAQUE) 300 MG/ML solution 100  mL (100 mLs Intravenous Contrast Given 05/01/19 1441)    ED Course  I have reviewed the triage vital signs and the nursing notes.  Pertinent labs & imaging results that were available during my care of the patient were reviewed by me and considered in my medical decision making (see chart for details).    MDM Rules/Calculators/A&P                      Pt presenting for evaluation of RLQ abdominal pain, nausea, polyuria, polydipsia.  Physical exam shows patient appears nontoxic.  She does have right lower quadrant abdominal tenderness.  There is a polyuria polydipsia, concern for diabetes.  With polyuria, consider UTI.  Patient has had a previous appendectomy.  Consider kidney stone.  Consider pyelo.  Will obtain labs, UA, and CT for further evaluation.  Labs overall reassuring.  No leukocytosis.  Electrolytes stable.  Lipase mildly elevated at 65, but no epigastric pain and exam is not consistent with  pancreatitis.  Hemoglobin A1c mildly elevated at six, shows patient is borderline.  Unknown previous to compare.  Urine without obvious infection.  Does show moderate leuks and few bacteria, however 11-20 squamous cells, thus a dirty sample.  Will send urine culture.  CT abdomen pelvis negative for acute findings.  On reassessment, patient reports improvement of symptoms with pain control and nausea control.  Discussed that at this time, there is no obvious acute or emergent cause for her symptoms.  Discussed continued symptomatic treatment at home and follow-up with primary as needed.  At this time, patient appears safe for discharge.  Return precautions given.  Patient states she understands and agrees to plan.  Final Clinical Impression(s) / ED Diagnoses Final diagnoses:  Right lower quadrant abdominal pain  Polyuria  Polydipsia  Elevated hemoglobin A1c    Rx / DC Orders ED Discharge Orders         Ordered    ondansetron (ZOFRAN) 4 MG tablet  Every 6 hours     05/01/19 1544           Gabbrielle Mcnicholas, PA-C 05/01/19 1623    Wyvonnia Dusky, MD 05/01/19 1752

## 2019-05-01 NOTE — Discharge Instructions (Signed)
Your work-up today was reassuring.  There were no obvious infections. We are going to grow your urine to see if there is any infection.  If there is, you receive a phone call in several days and some antibiotics. Your hemoglobin A1c was mildly elevated today, in the range of prediabetes.  Continue to follow-up with your primary care doctor regarding this. Use Zofran as needed for nausea or vomiting. Make sure you are staying well-hydrated with water. Return to the emergency room if you develop high fevers, persistent vomiting, severe worsening pain, any new, worsening, or concerning symptoms.

## 2019-05-02 LAB — URINE CULTURE: Culture: <10000 — AB

## 2019-05-03 ENCOUNTER — Encounter: Payer: Self-pay | Admitting: Family Medicine

## 2019-07-09 ENCOUNTER — Other Ambulatory Visit: Payer: Self-pay | Admitting: Family Medicine

## 2019-08-11 ENCOUNTER — Ambulatory Visit (INDEPENDENT_AMBULATORY_CARE_PROVIDER_SITE_OTHER): Payer: Commercial Managed Care - PPO | Admitting: Family Medicine

## 2019-08-11 ENCOUNTER — Other Ambulatory Visit: Payer: Self-pay

## 2019-08-11 VITALS — BP 130/80 | HR 70 | Temp 97.7°F | Wt 172.0 lb

## 2019-08-11 DIAGNOSIS — N3000 Acute cystitis without hematuria: Secondary | ICD-10-CM

## 2019-08-11 DIAGNOSIS — R300 Dysuria: Secondary | ICD-10-CM

## 2019-08-11 LAB — POCT URINALYSIS DIP (PROADVANTAGE DEVICE)
Bilirubin, UA: NEGATIVE
Blood, UA: NEGATIVE
Glucose, UA: NEGATIVE mg/dL
Ketones, POC UA: NEGATIVE mg/dL
Leukocytes, UA: NEGATIVE
Nitrite, UA: NEGATIVE
Protein Ur, POC: NEGATIVE mg/dL
Specific Gravity, Urine: 1.015
Urobilinogen, Ur: NEGATIVE
pH, UA: 8 (ref 5.0–8.0)

## 2019-08-11 MED ORDER — SULFAMETHOXAZOLE-TRIMETHOPRIM 800-160 MG PO TABS: 1.0000 | ORAL_TABLET | Freq: Two times a day (BID) | ORAL | 0 refills | Status: DC

## 2019-08-11 NOTE — Progress Notes (Signed)
   Subjective:    Patient ID: Jill Vargas, female    DOB: Sep 08, 1965, 54 y.o.   MRN: [id number]  HPI She complains of a several day history of dysuria, frequency, back pain but no fever or chills.   Review of Systems     Objective:   Physical Exam Alert and in no distress.  Urine dipstick was negative.       Assessment & Plan:  Acute cystitis without hematuria - Plan: sulfamethoxazole-trimethoprim (BACTRIM DS) 800-160 MG tablet  Burning with urination - Plan: POCT Urinalysis DIP (Proadvantage Device) Although the urinalysis was negative, her symptoms suggest UTI and I will therefore treat her.  She states that Septra has worked well in the past and I will therefore renew that.

## 2019-09-21 ENCOUNTER — Encounter: Payer: Self-pay | Admitting: Family Medicine

## 2019-09-21 ENCOUNTER — Ambulatory Visit (INDEPENDENT_AMBULATORY_CARE_PROVIDER_SITE_OTHER): Payer: Commercial Managed Care - PPO | Admitting: Family Medicine

## 2019-09-21 ENCOUNTER — Other Ambulatory Visit: Payer: Self-pay

## 2019-09-21 VITALS — BP 112/70 | HR 70 | Temp 98.4°F | Wt 170.8 lb

## 2019-09-21 DIAGNOSIS — H9202 Otalgia, left ear: Secondary | ICD-10-CM | POA: Diagnosis not present

## 2019-09-21 DIAGNOSIS — J029 Acute pharyngitis, unspecified: Secondary | ICD-10-CM | POA: Diagnosis not present

## 2019-09-21 LAB — POCT RAPID STREP A (OFFICE): Rapid Strep A Screen: NEGATIVE

## 2019-09-21 NOTE — Progress Notes (Signed)
   Subjective:    Patient ID: Jill Vargas, female    DOB: October 27, 1965, 54 y.o.   MRN: [id number]  HPI She has a 4-day history of left earache and cracking sensation in the ear as well as sore throat fever, chills, myalgias and fatigue.   Review of Systems     Objective:   Physical Exam Alert and in no distress. Tympanic membranes and canals are normal. Pharyngeal area is normal. Neck is supple without adenopathy or thyromegaly. Cardiac exam shows a regular sinus rhythm without murmurs or gallops. Lungs are clear to auscultation.        Assessment & Plan:  Sore throat - Plan: Rapid Strep A  Earache on left  strep is negative  Treat the symptoms

## 2019-11-10 ENCOUNTER — Other Ambulatory Visit: Payer: Self-pay | Admitting: Family Medicine

## 2019-11-30 ENCOUNTER — Encounter: Payer: Self-pay | Admitting: Family Medicine

## 2019-11-30 ENCOUNTER — Other Ambulatory Visit (INDEPENDENT_AMBULATORY_CARE_PROVIDER_SITE_OTHER): Payer: Commercial Managed Care - PPO

## 2019-11-30 ENCOUNTER — Telehealth (INDEPENDENT_AMBULATORY_CARE_PROVIDER_SITE_OTHER): Payer: Commercial Managed Care - PPO | Admitting: Family Medicine

## 2019-11-30 VITALS — Ht 62.0 in | Wt 167.0 lb

## 2019-11-30 DIAGNOSIS — U071 COVID-19: Secondary | ICD-10-CM

## 2019-11-30 DIAGNOSIS — R0989 Other specified symptoms and signs involving the circulatory and respiratory systems: Secondary | ICD-10-CM | POA: Diagnosis not present

## 2019-11-30 DIAGNOSIS — R5383 Other fatigue: Secondary | ICD-10-CM | POA: Diagnosis not present

## 2019-11-30 DIAGNOSIS — Z20822 Contact with and (suspected) exposure to covid-19: Secondary | ICD-10-CM | POA: Diagnosis not present

## 2019-11-30 LAB — POC COVID19 BINAXNOW: SARS Coronavirus 2 Ag: POSITIVE — AB

## 2019-11-30 NOTE — Progress Notes (Signed)
° °  Subjective:  Documentation for virtual audio and video telecommunications through caregility encounter:  This is actually a 2 part visit.  She was tested at our office for Covid and then the virtual is done.  The patient was located at home. 2 patient identifiers used.  The provider was located in the office. The patient did consent to this visit and is aware of possible charges through their insurance for this visit.  The other persons participating in this telemedicine service were none. Time spent on call was 12 minutes and in review of previous records 16 minutes total.  This virtual service is not related to other E/M service within previous 7 days.    Patient ID: Jill Vargas, female    DOB: 02/03/1966, 54 y.o.   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 Onset 11/28/2019  States she feels weak and tired.  Cannot taste now but can still smell.   Positive COVID-19 test done here at the office today.  She has not been taking anything for her symptoms.   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   No fever, chills, body aches, cough, shortness of breath, abdominal pain, N/V/D, LE edema.   Reviewed allergies, medications, past medical, surgical, family, and social history.    Review of Systems Pertinent positives and negatives in the history of present illness.     Objective:   Physical Exam Ht 5\' 2"  (1.575 m)    Wt 167 lb (75.8 kg)    BMI 30.54 kg/m   Alert and oriented and in no acute distress.  Speaking in complete sentences without difficulty      Assessment & Plan:  COVID-19 virus infection  Chest congestion  Fatigue, unspecified type  Symptom onset 2 days ago.  She will need to remain out of work until 10 days post onset of  symptoms which will be 12/08/2019.  Discussed that she will most likely meet criteria for IV antibody infusion. I will refer her to see if she does qualify.  Discussed symptomatic treatment including rest, hydration, Tylenol or ibuprofen, Mucinex and vitamin such as Zinc, vitamin C, D and elderberry. She will follow up with me on 12/08/2019 or sooner if needed.  Discussed isolation protocol.

## 2019-12-01 ENCOUNTER — Telehealth: Payer: Self-pay | Admitting: Nurse Practitioner

## 2019-12-01 ENCOUNTER — Encounter: Payer: Self-pay | Admitting: Nurse Practitioner

## 2019-12-01 ENCOUNTER — Other Ambulatory Visit: Payer: Self-pay | Admitting: Nurse Practitioner

## 2019-12-01 ENCOUNTER — Ambulatory Visit (HOSPITAL_COMMUNITY)
Admission: RE | Admit: 2019-12-01 | Discharge: 2019-12-01 | Disposition: A | Discharge status: Home / Self Care | Payer: Commercial Managed Care - PPO | Source: Ambulatory Visit | Attending: Pulmonary Disease | Admitting: Pulmonary Disease

## 2019-12-01 DIAGNOSIS — E669 Obesity, unspecified: Secondary | ICD-10-CM | POA: Insufficient documentation

## 2019-12-01 DIAGNOSIS — U071 COVID-19: Secondary | ICD-10-CM | POA: Insufficient documentation

## 2019-12-01 MED ORDER — FAMOTIDINE IN NACL 20-0.9 MG/50ML-% IV SOLN: 20.0000 mg | Freq: Once | INTRAVENOUS | Status: DC | PRN

## 2019-12-01 MED ORDER — EPINEPHRINE 0.3 MG/0.3ML IJ SOAJ: 0.3000 mg | Freq: Once | INTRAMUSCULAR | Status: DC | PRN

## 2019-12-01 MED ORDER — SODIUM CHLORIDE 0.9 % IV SOLN
1200.0000 mg | Freq: Once | INTRAVENOUS | Status: AC
  Administered 2019-12-01: 1200 mg via INTRAVENOUS
  Filled 2019-12-01: qty 10

## 2019-12-01 MED ORDER — DIPHENHYDRAMINE HCL 50 MG/ML IJ SOLN: 50.0000 mg | Freq: Once | INTRAMUSCULAR | Status: DC | PRN

## 2019-12-01 MED ORDER — ALBUTEROL SULFATE HFA 108 (90 BASE) MCG/ACT IN AERS: 2.0000 | INHALATION_SPRAY | Freq: Once | RESPIRATORY_TRACT | Status: DC | PRN

## 2019-12-01 MED ORDER — SODIUM CHLORIDE 0.9 % IV SOLN: INTRAVENOUS | Status: DC | PRN

## 2019-12-01 MED ORDER — METHYLPREDNISOLONE SODIUM SUCC 125 MG IJ SOLR: 125.0000 mg | Freq: Once | INTRAMUSCULAR | Status: DC | PRN

## 2019-12-01 NOTE — Progress Notes (Signed)
I connected by phone with Jill Vargas on 12/01/2019 at 11:53 AM to discuss the potential use of a new treatment for mild to moderate COVID-19 viral infection in non-hospitalized patients.  This patient is a 54 y.o. female that meets the FDA criteria for Emergency Use Authorization of COVID monoclonal antibody casirivimab/imdevimab.  Has a (+) direct SARS-CoV-2 viral test result  Has mild or moderate COVID-19   Is NOT hospitalized due to COVID-19  Is within 10 days of symptom onset  Has at least one of the high risk factor(s) for progression to severe COVID-19 and/or hospitalization as defined in EUA.  Specific high risk criteria : BMI > 25   I have spoken and communicated the following to the patient or parent/caregiver regarding COVID monoclonal antibody treatment:  1. FDA has authorized the emergency use for the treatment of mild to moderate COVID-19 in adults and pediatric patients with positive results of direct SARS-CoV-2 viral testing who are 91 years of age and older weighing at least 40 kg, and who are at high risk for progressing to severe COVID-19 and/or hospitalization.  2. The significant known and potential risks and benefits of COVID monoclonal antibody, and the extent to which such potential risks and benefits are unknown.  3. Information on available alternative treatments and the risks and benefits of those alternatives, including clinical trials.  4. Patients treated with COVID monoclonal antibody should continue to self-isolate and use infection control measures (e.g., wear mask, isolate, social distance, avoid sharing personal items, clean and disinfect "high touch" surfaces, and frequent handwashing) according to CDC guidelines.   5. The patient or parent/caregiver has the option to accept or refuse COVID monoclonal antibody treatment.  After reviewing this information with the patient, The patient agreed to proceed with receiving casirivimab\imdevimab infusion and  will be provided a copy of the Fact sheet prior to receiving the infusion.  Murray Hodgkins, NP 12/01/2019 11:53 AM

## 2019-12-01 NOTE — Discharge Instructions (Signed)

## 2019-12-01 NOTE — Telephone Encounter (Signed)
I connected by phone with Jill Vargas on 12/01/2019 at 11:53 AM to discuss the potential use of a new treatment for mild to moderate COVID-19 viral infection in non-hospitalized patients.  This patient is a 54 y.o. female that meets the FDA criteria for Emergency Use Authorization of COVID monoclonal antibody casirivimab/imdevimab.  Has a (+) direct SARS-CoV-2 viral test result  Has mild or moderate COVID-19   Is NOT hospitalized due to COVID-19  Is within 10 days of symptom onset  Has at least one of the high risk factor(s) for progression to severe COVID-19 and/or hospitalization as defined in EUA.  Specific high risk criteria : BMI > 25   I have spoken and communicated the following to the patient or parent/caregiver regarding COVID monoclonal antibody treatment:  1. FDA has authorized the emergency use for the treatment of mild to moderate COVID-19 in adults and pediatric patients with positive results of direct SARS-CoV-2 viral testing who are 55 years of age and older weighing at least 40 kg, and who are at high risk for progressing to severe COVID-19 and/or hospitalization.  2. The significant known and potential risks and benefits of COVID monoclonal antibody, and the extent to which such potential risks and benefits are unknown.  3. Information on available alternative treatments and the risks and benefits of those alternatives, including clinical trials.  4. Patients treated with COVID monoclonal antibody should continue to self-isolate and use infection control measures (e.g., wear mask, isolate, social distance, avoid sharing personal items, clean and disinfect "high touch" surfaces, and frequent handwashing) according to CDC guidelines.   5. The patient or parent/caregiver has the option to accept or refuse COVID monoclonal antibody treatment.  After reviewing this information with the patient, The patient agreed to proceed with receiving casirivimab\imdevimab infusion and  will be provided a copy of the Fact sheet prior to receiving the infusion.  Murray Hodgkins, NP 12/01/2019 11:53 AM

## 2019-12-01 NOTE — Progress Notes (Signed)
  Diagnosis: COVID-19  Physician: Dr Joya Gaskins  Procedure: [facility] Infusion [facility] Med: casirivimab\imdevimab infusion - Provided patient with casirivimab\imdevimab fact sheet for patients, parents and caregivers prior to infusion.  Complications: No immediate complications noted.  Discharge: Discharged home   Jill Vargas 12/01/2019

## 2019-12-08 ENCOUNTER — Other Ambulatory Visit: Payer: Self-pay

## 2019-12-08 ENCOUNTER — Encounter: Payer: Self-pay | Admitting: Family Medicine

## 2019-12-08 ENCOUNTER — Telehealth (INDEPENDENT_AMBULATORY_CARE_PROVIDER_SITE_OTHER): Payer: Commercial Managed Care - PPO | Admitting: Family Medicine

## 2019-12-08 DIAGNOSIS — U071 COVID-19: Secondary | ICD-10-CM | POA: Diagnosis not present

## 2019-12-08 DIAGNOSIS — R0602 Shortness of breath: Secondary | ICD-10-CM

## 2019-12-08 DIAGNOSIS — R059 Cough, unspecified: Secondary | ICD-10-CM

## 2019-12-08 DIAGNOSIS — R05 Cough: Secondary | ICD-10-CM

## 2019-12-08 NOTE — Progress Notes (Signed)
   Subjective:  Documentation for virtual audio and video telecommunications through [place] encounter:  The patient was located at home. 2 patient identifiers used.  The provider was located in the office. The patient did consent to this visit and is aware of possible charges through their insurance for this visit.  The other persons participating in this telemedicine service were none. Time spent on call was 12  minutes and in review of previous records 15 minutes total.  This virtual service is not related to other E/M service within previous 7 days.   Patient ID: Jill Vargas, female    DOB: 29-Mar-1965, 54 y.o.   MRN: [id number]  HPI Chief Complaint  Patient presents with  . follow-up    follow-up on covid- some SOB, sob will wake up out of sleep. chest tightness   She was diagnosed with Covid infection and onset of symptoms was 11/28/2019.  This was a breakthrough infection since she was fully vaccinated for Covid.   Received IV infusion of monoclonal antibodies on 12/01/2019.   States today she feels a "little better". States she has more energy. She has been able to get up today and clean her house some and cook for herself. Today is the first day she has been able to do this.   States she is still having chest tightness, shortness of breath, and dry cough.   She still cannot taste and cannot smell.   No fever.   She is just taking vitamins now and stay hydrated.   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   Reviewed allergies, medications, past medical, surgical, family, and social history.    Review of Systems Pertinent positives and negatives in the history of present illness.     Objective:   Physical Exam There were no vitals taken for this visit.  Alert and oriented and in no acute distress.  Respirations unlabored.  Speaking in complete sentences without difficulty.  Normal speech, mood and thought process.      Assessment & Plan:  COVID-19  virus infection  Cough  Shortness of breath  She received the IV monoclonal antibody infusion and states she believes this helped save her life. Reports feeling significantly better but she is still having some shortness of breath, chest tightness and dry cough. Today is day 10 from onset of symptoms.  She works as a Training and development officer at [employer].  I plan to release her to return to work on Monday December 13, 2019 as long as she is continuing to improve.

## 2020-01-14 ENCOUNTER — Telehealth: Payer: Self-pay | Admitting: Family Medicine

## 2020-01-14 MED ORDER — ATORVASTATIN CALCIUM 20 MG PO TABS
20.0000 mg | ORAL_TABLET | Freq: Every day | ORAL | 1 refills | Status: DC
Start: 2020-01-14 — End: 2020-03-22

## 2020-01-14 NOTE — Telephone Encounter (Signed)
Pt called and she needs refill on cholesterol meds

## 2020-01-14 NOTE — Telephone Encounter (Signed)
done

## 2020-01-17 IMAGING — US US ABDOMEN LIMITED
1 series · 14 of 25 positions shown · non-contrast
Comparison: None.

CLINICAL DATA: RIGHT upper quadrant pain and epigastric pain

EXAM:
ULTRASOUND ABDOMEN LIMITED RIGHT UPPER QUADRANT

[Series 1: us abdomen limited · 0.23mm/px · 14 of 48 slices shown]
[im 1/48]
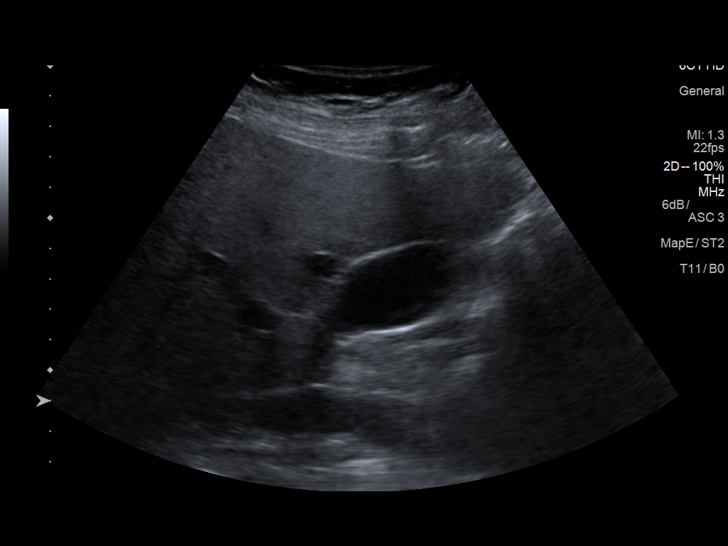
[im 4/48]
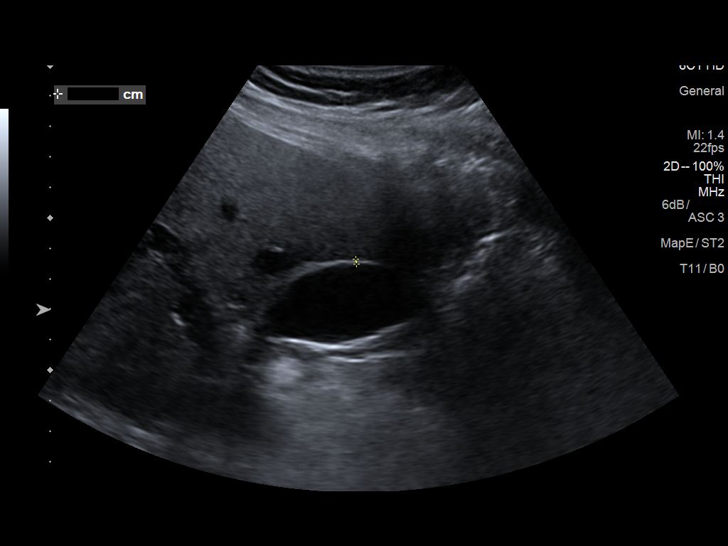
[im 8/48]
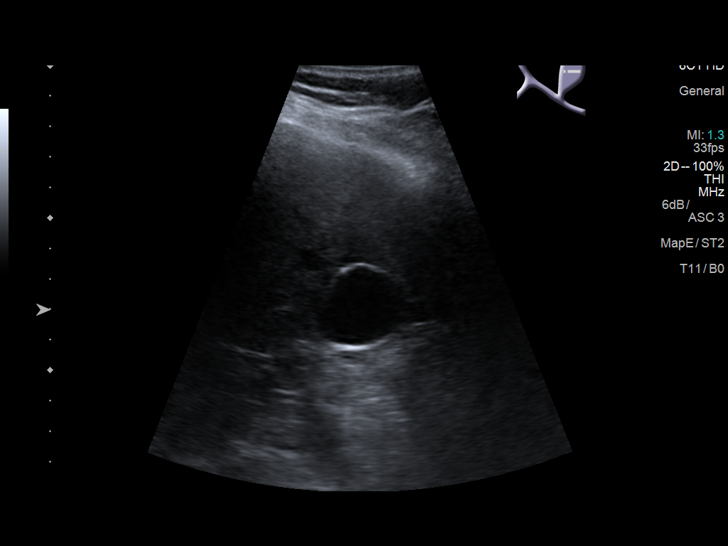
[im 12/48]
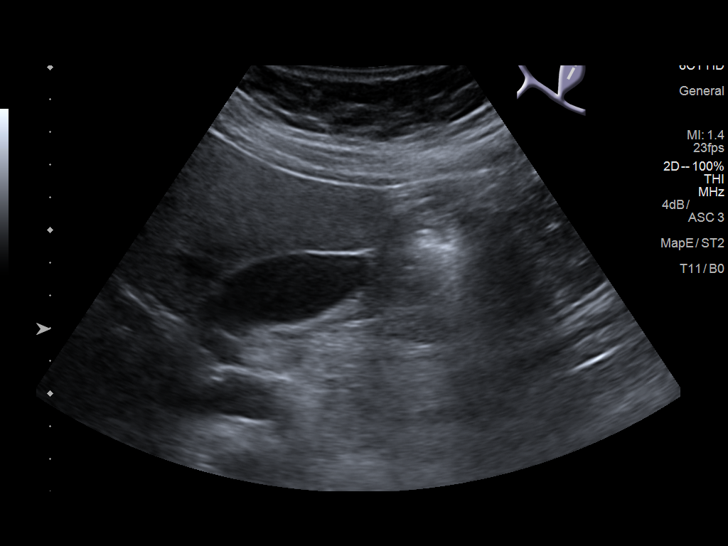
[im 16/48]
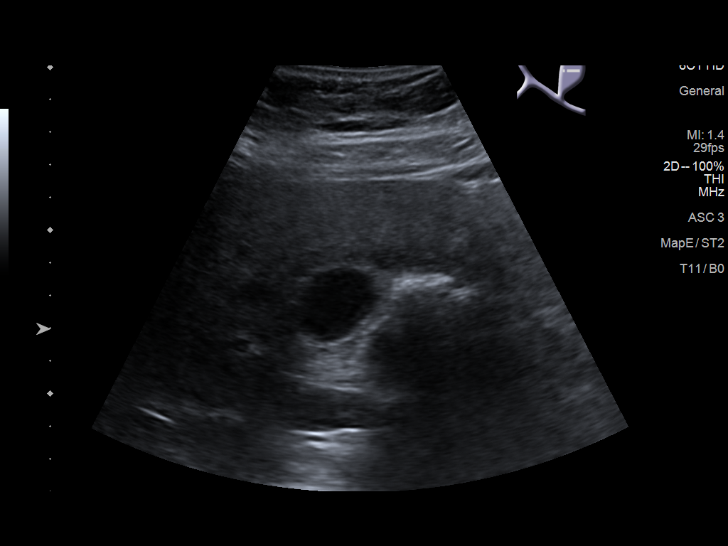
[im 18/48]
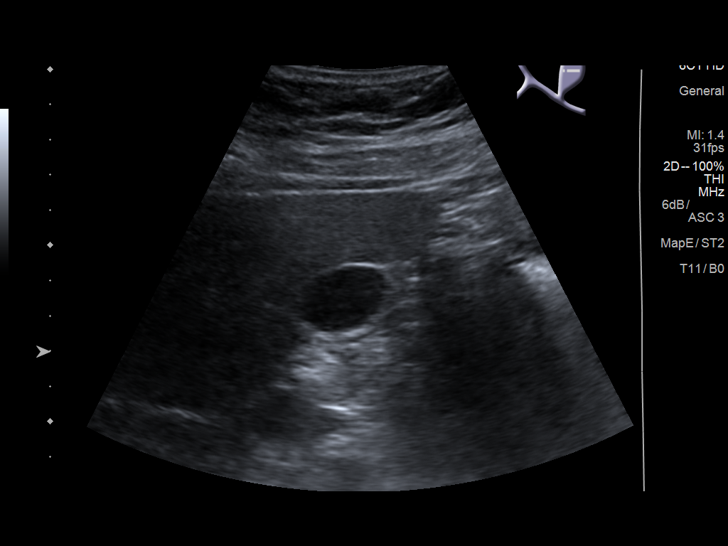
[im 22/48]
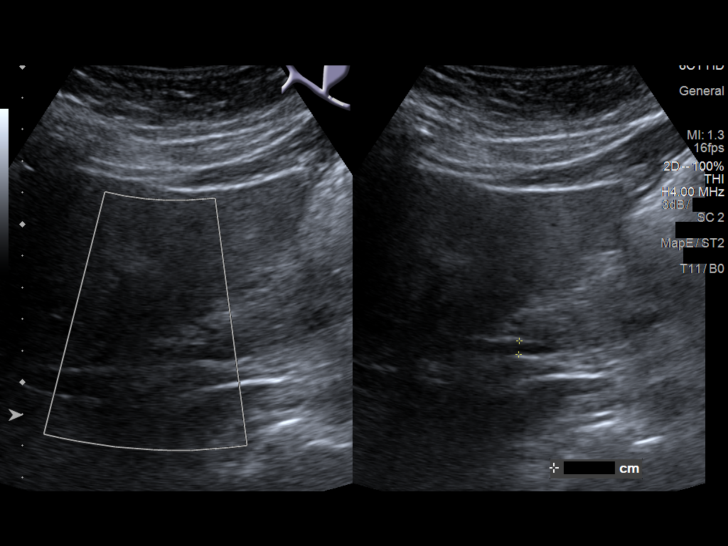
[im 26/48]
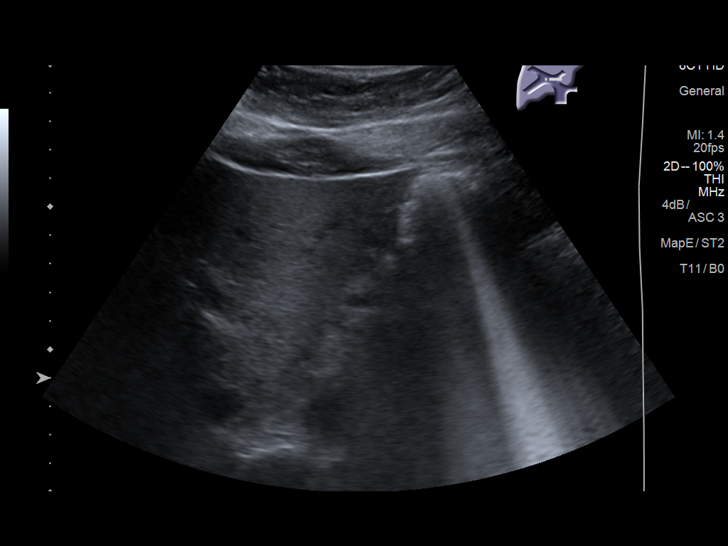
[im 30/48]
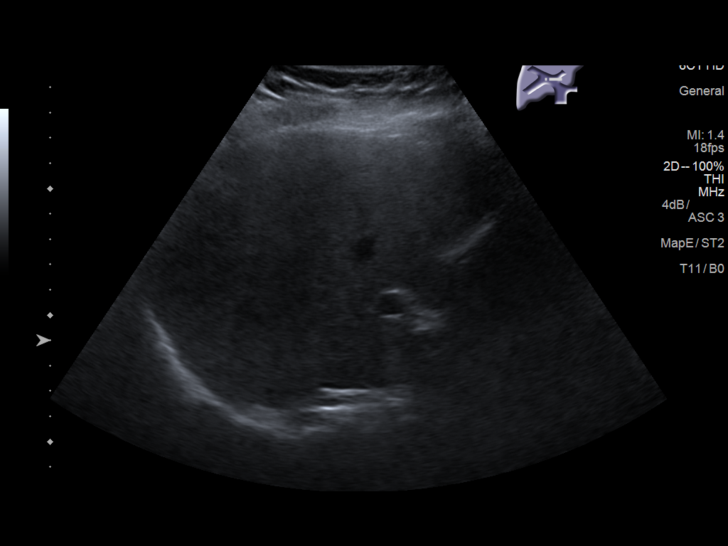
[im 32/48]
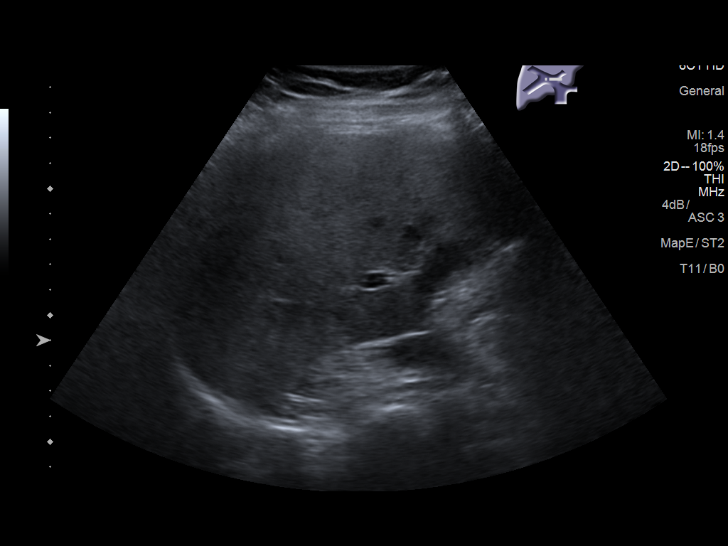
[im 36/48]
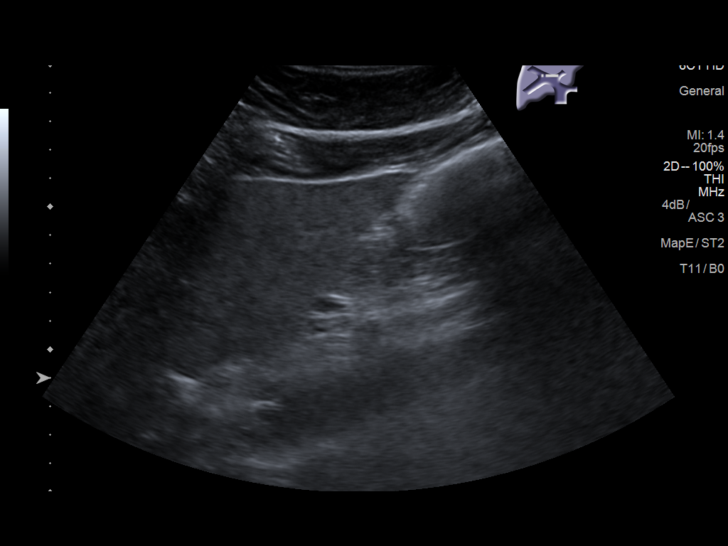
[im 40/48]
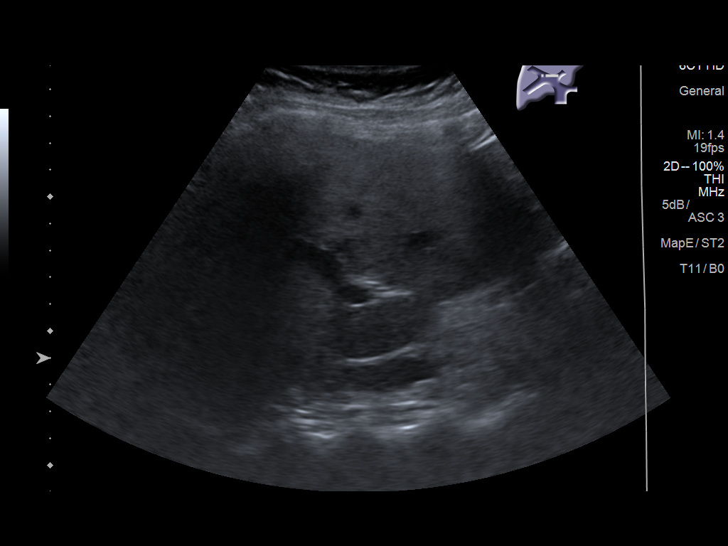
[im 44/48]
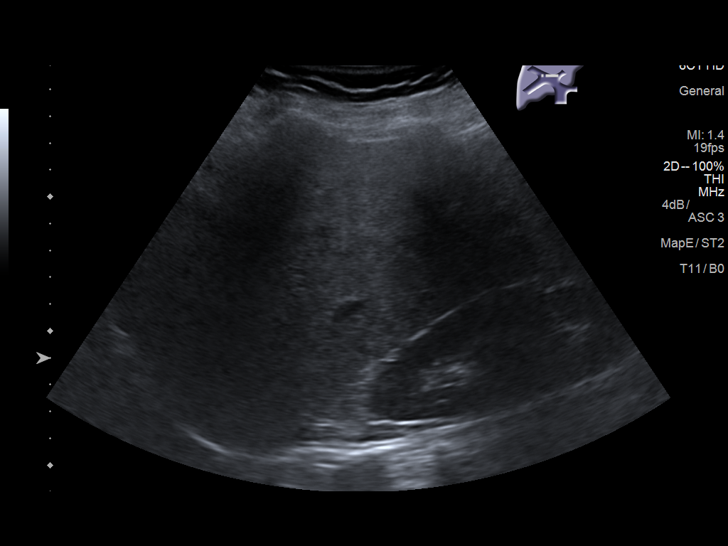
[im 48/48]
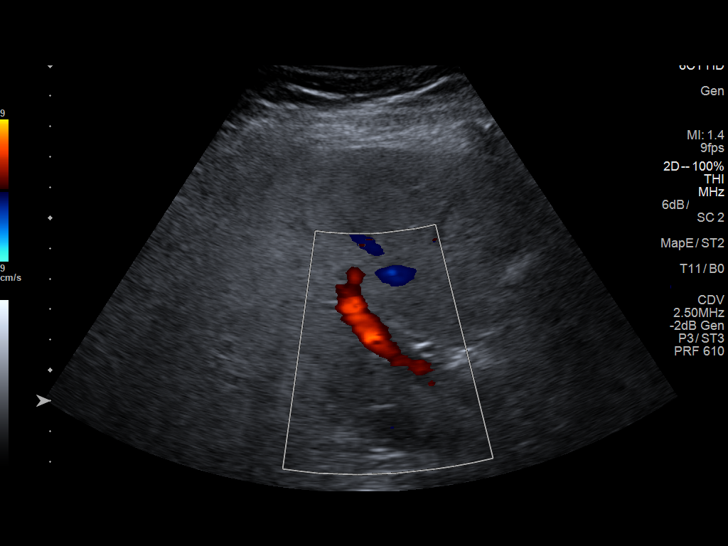

[14 of 25 positions shown; findings below may reference images not displayed]

FINDINGS: Gallbladder:

No gallstones or wall thickening visualized. No sonographic Murphy
sign noted by sonographer.

Common bile duct:

Diameter: Normal at 4 mm

Liver:

There is mild uniform increase in hepatic echogenicity. No biliary
duct dilatation. portal vein is patent on color Doppler imaging with
normal direction of blood flow towards the liver.
IMPRESSION: 1. Normal gallbladder.
2. Increased hepatic echogenicity commonly represents hepatic
steatosis.

## 2020-01-31 ENCOUNTER — Ambulatory Visit: Payer: Commercial Managed Care - PPO | Admitting: Family Medicine

## 2020-02-01 ENCOUNTER — Ambulatory Visit: Payer: Commercial Managed Care - PPO | Admitting: Family Medicine

## 2020-03-22 ENCOUNTER — Telehealth: Payer: Self-pay | Admitting: Family Medicine

## 2020-03-22 MED ORDER — ATORVASTATIN CALCIUM 20 MG PO TABS
20.0000 mg | ORAL_TABLET | Freq: Every day | ORAL | 0 refills | Status: DC
Start: 2020-03-22 — End: 2020-06-13

## 2020-03-22 NOTE — Telephone Encounter (Signed)
Patient needs refill on Lipitor  She has been out for about a month  Scheduled for CPE  March 28th   [facility] in [place]

## 2020-06-13 ENCOUNTER — Other Ambulatory Visit: Payer: Self-pay | Admitting: Family Medicine

## 2020-06-19 ENCOUNTER — Encounter: Payer: Commercial Managed Care - PPO | Admitting: Family Medicine

## 2020-06-19 DIAGNOSIS — R7303 Prediabetes: Secondary | ICD-10-CM

## 2020-06-19 DIAGNOSIS — R7989 Other specified abnormal findings of blood chemistry: Secondary | ICD-10-CM

## 2020-06-19 DIAGNOSIS — E782 Mixed hyperlipidemia: Secondary | ICD-10-CM

## 2020-06-19 DIAGNOSIS — E559 Vitamin D deficiency, unspecified: Secondary | ICD-10-CM

## 2020-06-19 DIAGNOSIS — E669 Obesity, unspecified: Secondary | ICD-10-CM

## 2020-06-19 DIAGNOSIS — Z8669 Personal history of other diseases of the nervous system and sense organs: Secondary | ICD-10-CM

## 2020-06-28 ENCOUNTER — Encounter: Payer: Self-pay | Admitting: Family Medicine

## 2020-07-10 ENCOUNTER — Encounter: Payer: Self-pay | Admitting: Family Medicine

## 2020-07-10 ENCOUNTER — Other Ambulatory Visit: Payer: Self-pay

## 2020-07-10 ENCOUNTER — Ambulatory Visit (INDEPENDENT_AMBULATORY_CARE_PROVIDER_SITE_OTHER): Payer: Commercial Managed Care - PPO | Admitting: Family Medicine

## 2020-07-10 VITALS — BP 120/80 | HR 60 | Ht 62.0 in | Wt 173.2 lb

## 2020-07-10 DIAGNOSIS — R5383 Other fatigue: Secondary | ICD-10-CM | POA: Diagnosis not present

## 2020-07-10 DIAGNOSIS — R42 Dizziness and giddiness: Secondary | ICD-10-CM | POA: Diagnosis not present

## 2020-07-10 DIAGNOSIS — R350 Frequency of micturition: Secondary | ICD-10-CM | POA: Diagnosis not present

## 2020-07-10 LAB — POCT URINALYSIS DIP (PROADVANTAGE DEVICE)
Bilirubin, UA: NEGATIVE
Blood, UA: NEGATIVE
Glucose, UA: NEGATIVE mg/dL
Ketones, POC UA: NEGATIVE mg/dL
Nitrite, UA: NEGATIVE
Protein Ur, POC: NEGATIVE mg/dL
Specific Gravity, Urine: 1.015
Urobilinogen, Ur: 0.2
pH, UA: 7 (ref 5.0–8.0)

## 2020-07-10 NOTE — Progress Notes (Signed)
   Subjective:    Patient ID: Jill Vargas, female    DOB: Apr 17, 1965, 55 y.o.   MRN: [id number]  HPI She is here for evaluation of dizziness as well as some slight fatigue and question of left-sided low back discomfort.  She also complains of numbness and tingling sensation in her hands.  No blurred vision, double vision, headache, falling on or clumsiness.  At the end of the encounter, she did mentioned having polyuria but no other symptoms.   Review of Systems     Objective:   Physical Exam Alert and in no distress.  EOMI.  Other cranial nerves grossly intact.  No carotid bruits noted.  DTRs normal.  Normal cerebellar.  Tympanic membranes and canals are normal. Pharyngeal area is normal. Neck is supple without adenopathy or thyromegaly. Cardiac exam shows a regular sinus rhythm without murmurs or gallops. Lungs are clear to auscultation.  Urine microscopic was contaminated.      Assessment & Plan:  Fatigue, unspecified type  Dizziness  Urinary frequency - Plan: POCT Urinalysis DIP (Proadvantage Device), Urine Culture I explained that I did not see any red flags concerning her dizziness and that watchful waiting would be appropriate.  I then discussed the urinalysis and the fact that it was contaminated.  Recommend waiting on the culture to see exactly what is going on. Also recommend she set up for complete exam with Jocelyn Lamer.

## 2020-07-12 LAB — URINE CULTURE

## 2020-08-01 ENCOUNTER — Other Ambulatory Visit: Payer: Self-pay | Admitting: Family Medicine

## 2020-08-14 ENCOUNTER — Ambulatory Visit (INDEPENDENT_AMBULATORY_CARE_PROVIDER_SITE_OTHER): Payer: Commercial Managed Care - PPO | Admitting: Family Medicine

## 2020-08-14 ENCOUNTER — Other Ambulatory Visit: Payer: Self-pay

## 2020-08-14 ENCOUNTER — Encounter: Payer: Self-pay | Admitting: Family Medicine

## 2020-08-14 VITALS — BP 130/80 | HR 58 | Temp 96.7°F | Ht 62.0 in | Wt 170.4 lb

## 2020-08-14 DIAGNOSIS — Z23 Encounter for immunization: Secondary | ICD-10-CM

## 2020-08-14 DIAGNOSIS — G5601 Carpal tunnel syndrome, right upper limb: Secondary | ICD-10-CM

## 2020-08-14 DIAGNOSIS — R7303 Prediabetes: Secondary | ICD-10-CM

## 2020-08-14 DIAGNOSIS — E559 Vitamin D deficiency, unspecified: Secondary | ICD-10-CM

## 2020-08-14 DIAGNOSIS — E669 Obesity, unspecified: Secondary | ICD-10-CM

## 2020-08-14 DIAGNOSIS — Z1159 Encounter for screening for other viral diseases: Secondary | ICD-10-CM

## 2020-08-14 DIAGNOSIS — Z1211 Encounter for screening for malignant neoplasm of colon: Secondary | ICD-10-CM

## 2020-08-14 DIAGNOSIS — Z8669 Personal history of other diseases of the nervous system and sense organs: Secondary | ICD-10-CM | POA: Diagnosis not present

## 2020-08-14 DIAGNOSIS — R7989 Other specified abnormal findings of blood chemistry: Secondary | ICD-10-CM

## 2020-08-14 DIAGNOSIS — Z8616 Personal history of COVID-19: Secondary | ICD-10-CM

## 2020-08-14 DIAGNOSIS — U071 COVID-19: Secondary | ICD-10-CM

## 2020-08-14 DIAGNOSIS — E782 Mixed hyperlipidemia: Secondary | ICD-10-CM

## 2020-08-14 LAB — POCT GLYCOSYLATED HEMOGLOBIN (HGB A1C): Hemoglobin A1C: 6 % — AB (ref 4.0–5.6)

## 2020-08-14 MED ORDER — ATORVASTATIN CALCIUM 20 MG PO TABS: 1.0000 | ORAL_TABLET | Freq: Every day | ORAL | 3 refills | Status: DC

## 2020-08-14 NOTE — Progress Notes (Signed)
   Subjective:    Patient ID: Jill Vargas, female    DOB: 1965/07/23, 55 y.o.   MRN: [id number]  HPI She is here for an interval evaluation.  She does have history of prediabetes and also abnormal TSH.  Her weight has been stable.  She continues on Lipitor without difficulty.  She does follow-up with ophthalmology concerning her glaucoma.  She occasionally does have difficulty with right CVTS type symptoms.  She does not smoke and drinks rarely.  She is not interested at the present time in pursuing Pap, pelvic and mammogram.  She does have a history of vitamin D deficiency.  Review of Systems     Objective:   Physical Exam Alert and in no distress. Tympanic membranes and canals are normal. Pharyngeal area is normal. Neck is supple without adenopathy or thyromegaly. Cardiac exam shows a regular sinus rhythm without murmurs or gallops. Lungs are clear to auscultation. Hemoglobin A1c is 6.0 Her medical record was reviewed.      Assessment & Plan:  COVID-19 virus infection  Obesity (BMI 30-39.9) - Plan: CBC with Differential/Platelet, Comprehensive metabolic panel, Lipid panel  Vitamin D deficiency - Plan: VITAMIN D 25 Hydroxy (Vit-D Deficiency, Fractures)  History of glaucoma  Pre-diabetes - Plan: Comprehensive metabolic panel, POCT glycosylated hemoglobin (Hb A1C)  Mixed hyperlipidemia - Plan: Lipid panel, atorvastatin (LIPITOR) 20 MG tablet  Abnormal thyroid stimulating hormone (TSH) level - Plan: TSH  History of COVID-19  Screening for colon cancer - Plan: Cologuard, CBC with Differential/Platelet  Need for hepatitis C screening test - Plan: Hepatitis C antibody  Immunization, viral disease - Plan: PFIZER Comirnaty(GRAY TOP)COVID-19 Vaccine  Carpal tunnel syndrome on right Encouraged her to continue with making changes in diet and exercise to keep on hopefully getting diabetes.  Continue on Lipitor.

## 2020-08-15 LAB — COMPREHENSIVE METABOLIC PANEL
ALT: 36 IU/L — ABNORMAL HIGH (ref 0–32)
AST: 25 IU/L (ref 0–40)
Albumin/Globulin Ratio: 2.3 — ABNORMAL HIGH (ref 1.2–2.2)
Albumin: 4.4 g/dL (ref 3.8–4.9)
Alkaline Phosphatase: 98 IU/L (ref 44–121)
BUN/Creatinine Ratio: 15 (ref 9–23)
BUN: 12 mg/dL (ref 6–24)
Bilirubin Total: 0.8 mg/dL (ref 0.0–1.2)
CO2: 18 mmol/L — ABNORMAL LOW (ref 20–29)
Calcium: 9.4 mg/dL (ref 8.7–10.2)
Chloride: 104 mmol/L (ref 96–106)
Creatinine, Ser: 0.8 mg/dL (ref 0.57–1.00)
Globulin, Total: 1.9 g/dL (ref 1.5–4.5)
Glucose: 174 mg/dL — ABNORMAL HIGH (ref 65–99)
Potassium: 3.9 mmol/L (ref 3.5–5.2)
Sodium: 140 mmol/L (ref 134–144)
Total Protein: 6.3 g/dL (ref 6.0–8.5)
eGFR: 88 mL/min/{1.73_m2} (ref >59)

## 2020-08-15 LAB — CBC WITH DIFFERENTIAL/PLATELET
Basophils Absolute: 0 10*3/uL (ref 0.0–0.2)
Basos: 0 %
EOS (ABSOLUTE): 0.2 10*3/uL (ref 0.0–0.4)
Eos: 3 %
Hematocrit: 43.9 % (ref 34.0–46.6)
Hemoglobin: 15 g/dL (ref 11.1–15.9)
Immature Grans (Abs): 0.1 10*3/uL (ref 0.0–0.1)
Immature Granulocytes: 1 %
Lymphocytes Absolute: 2.1 10*3/uL (ref 0.7–3.1)
Lymphs: 31 %
MCH: 30 pg (ref 26.6–33.0)
MCHC: 34.2 g/dL (ref 31.5–35.7)
MCV: 88 fL (ref 79–97)
Monocytes Absolute: 0.4 10*3/uL (ref 0.1–0.9)
Monocytes: 6 %
Neutrophils Absolute: 4 10*3/uL (ref 1.4–7.0)
Neutrophils: 59 %
Platelets: 221 10*3/uL (ref 150–450)
RBC: 5 x10E6/uL (ref 3.77–5.28)
RDW: 12.9 % (ref 11.7–15.4)
WBC: 6.7 10*3/uL (ref 3.4–10.8)

## 2020-08-15 LAB — LIPID PANEL
Chol/HDL Ratio: 6.4 ratio — ABNORMAL HIGH (ref 0.0–4.4)
Cholesterol, Total: 236 mg/dL — ABNORMAL HIGH (ref 100–199)
HDL: 37 mg/dL — ABNORMAL LOW (ref >39)
LDL Chol Calc (NIH): 144 mg/dL — ABNORMAL HIGH (ref 0–99)
Triglycerides: 301 mg/dL — ABNORMAL HIGH (ref 0–149)
VLDL Cholesterol Cal: 55 mg/dL — ABNORMAL HIGH (ref 5–40)

## 2020-08-15 LAB — TSH: TSH: 1.53 u[IU]/mL (ref 0.450–4.500)

## 2020-08-15 LAB — VITAMIN D 25 HYDROXY (VIT D DEFICIENCY, FRACTURES): Vit D, 25-Hydroxy: 21.5 ng/mL — ABNORMAL LOW (ref 30.0–100.0)

## 2020-08-15 LAB — HEPATITIS C ANTIBODY: Hep C Virus Ab: <0.1 s/co ratio (ref 0.0–0.9)

## 2020-09-05 ENCOUNTER — Other Ambulatory Visit: Payer: Self-pay | Admitting: Family Medicine

## 2020-09-05 DIAGNOSIS — E782 Mixed hyperlipidemia: Secondary | ICD-10-CM

## 2020-10-19 ENCOUNTER — Other Ambulatory Visit: Payer: Self-pay

## 2020-10-19 DIAGNOSIS — Z1211 Encounter for screening for malignant neoplasm of colon: Secondary | ICD-10-CM

## 2021-02-13 ENCOUNTER — Telehealth: Payer: Self-pay | Admitting: Family Medicine

## 2021-02-13 NOTE — Telephone Encounter (Signed)
Dismissal letter in guarantor snapshot  °

## 2021-05-13 ENCOUNTER — Other Ambulatory Visit: Payer: Self-pay | Admitting: Family Medicine

## 2021-05-13 DIAGNOSIS — E782 Mixed hyperlipidemia: Secondary | ICD-10-CM

## 2021-09-26 ENCOUNTER — Emergency Department (HOSPITAL_COMMUNITY): Payer: 59

## 2021-09-26 ENCOUNTER — Other Ambulatory Visit: Payer: Self-pay

## 2021-09-26 ENCOUNTER — Emergency Department (HOSPITAL_COMMUNITY)
Admission: EM | Admit: 2021-09-26 | Discharge: 2021-09-26 | Disposition: A | Discharge status: Home / Self Care | Payer: 59 | Attending: Emergency Medicine | Admitting: Emergency Medicine

## 2021-09-26 ENCOUNTER — Encounter (HOSPITAL_COMMUNITY): Payer: Self-pay

## 2021-09-26 DIAGNOSIS — F439 Reaction to severe stress, unspecified: Secondary | ICD-10-CM | POA: Diagnosis not present

## 2021-09-26 DIAGNOSIS — F444 Conversion disorder with motor symptom or deficit: Secondary | ICD-10-CM

## 2021-09-26 DIAGNOSIS — E875 Hyperkalemia: Secondary | ICD-10-CM | POA: Diagnosis not present

## 2021-09-26 DIAGNOSIS — Z20822 Contact with and (suspected) exposure to covid-19: Secondary | ICD-10-CM | POA: Diagnosis not present

## 2021-09-26 DIAGNOSIS — R531 Weakness: Secondary | ICD-10-CM | POA: Diagnosis not present

## 2021-09-26 DIAGNOSIS — R479 Unspecified speech disturbances: Secondary | ICD-10-CM | POA: Diagnosis present

## 2021-09-26 DIAGNOSIS — F4320 Adjustment disorder, unspecified: Secondary | ICD-10-CM

## 2021-09-26 LAB — CBC
HCT: 45.7 % (ref 36.0–46.0)
Hemoglobin: 15.9 g/dL — ABNORMAL HIGH (ref 12.0–15.0)
MCH: 30.8 pg (ref 26.0–34.0)
MCHC: 34.8 g/dL (ref 30.0–36.0)
MCV: 88.6 fL (ref 80.0–100.0)
Platelets: 183 10*3/uL (ref 150–400)
RBC: 5.16 MIL/uL — ABNORMAL HIGH (ref 3.87–5.11)
RDW: 12.8 % (ref 11.5–15.5)
WBC: 7.7 10*3/uL (ref 4.0–10.5)
nRBC: 0 % (ref 0.0–0.2)

## 2021-09-26 LAB — COMPREHENSIVE METABOLIC PANEL
ALT: 30 U/L (ref 0–44)
AST: 32 U/L (ref 15–41)
Albumin: 4.1 g/dL (ref 3.5–5.0)
Alkaline Phosphatase: 73 U/L (ref 38–126)
Anion gap: 11 (ref 5–15)
BUN: 10 mg/dL (ref 6–20)
CO2: 21 mmol/L — ABNORMAL LOW (ref 22–32)
Calcium: 9 mg/dL (ref 8.9–10.3)
Chloride: 107 mmol/L (ref 98–111)
Creatinine, Ser: 0.86 mg/dL (ref 0.44–1.00)
GFR, Estimated: >60 mL/min (ref >60)
Glucose, Bld: 133 mg/dL — ABNORMAL HIGH (ref 70–99)
Potassium: 5.2 mmol/L — ABNORMAL HIGH (ref 3.5–5.1)
Sodium: 139 mmol/L (ref 135–145)
Total Bilirubin: 1.5 mg/dL — ABNORMAL HIGH (ref 0.3–1.2)
Total Protein: 6.4 g/dL — ABNORMAL LOW (ref 6.5–8.1)

## 2021-09-26 LAB — RESP PANEL BY RT-PCR (FLU A&B, COVID) ARPGX2
Influenza A by PCR: NEGATIVE
Influenza B by PCR: NEGATIVE
SARS Coronavirus 2 by RT PCR: NEGATIVE

## 2021-09-26 LAB — I-STAT BETA HCG BLOOD, ED (MC, WL, AP ONLY): I-stat hCG, quantitative: <5 m[IU]/mL (ref <5)

## 2021-09-26 LAB — I-STAT CHEM 8, ED
BUN: 12 mg/dL (ref 6–20)
Calcium, Ion: 1.05 mmol/L — ABNORMAL LOW (ref 1.15–1.40)
Chloride: 107 mmol/L (ref 98–111)
Creatinine, Ser: 0.7 mg/dL (ref 0.44–1.00)
Glucose, Bld: 131 mg/dL — ABNORMAL HIGH (ref 70–99)
HCT: 44 % (ref 36.0–46.0)
Hemoglobin: 15 g/dL (ref 12.0–15.0)
Potassium: 4.8 mmol/L (ref 3.5–5.1)
Sodium: 139 mmol/L (ref 135–145)
TCO2: 22 mmol/L (ref 22–32)

## 2021-09-26 LAB — DIFFERENTIAL
Abs Immature Granulocytes: 0.04 10*3/uL (ref 0.00–0.07)
Basophils Absolute: 0 10*3/uL (ref 0.0–0.1)
Basophils Relative: 1 %
Eosinophils Absolute: 0.1 10*3/uL (ref 0.0–0.5)
Eosinophils Relative: 1 %
Immature Granulocytes: 1 %
Lymphocytes Relative: 30 %
Lymphs Abs: 2.3 10*3/uL (ref 0.7–4.0)
Monocytes Absolute: 0.6 10*3/uL (ref 0.1–1.0)
Monocytes Relative: 7 %
Neutro Abs: 4.7 10*3/uL (ref 1.7–7.7)
Neutrophils Relative %: 60 %

## 2021-09-26 LAB — CBG MONITORING, ED: Glucose-Capillary: 150 mg/dL — ABNORMAL HIGH (ref 70–99)

## 2021-09-26 LAB — APTT: aPTT: 20 seconds — ABNORMAL LOW (ref 24–36)

## 2021-09-26 LAB — PROTIME-INR
INR: 1.2 (ref 0.8–1.2)
Prothrombin Time: 15.2 seconds (ref 11.4–15.2)

## 2021-09-26 LAB — ETHANOL: Alcohol, Ethyl (B): <10 mg/dL (ref <10)

## 2021-09-26 MED ORDER — HYDROXYZINE HCL 25 MG PO TABS: 25.0000 mg | ORAL_TABLET | Freq: Three times a day (TID) | ORAL | 0 refills | Status: AC | PRN

## 2021-09-26 MED ORDER — DIAZEPAM 5 MG/ML IJ SOLN
5.0000 mg | Freq: Once | INTRAMUSCULAR | Status: AC
  Administered 2021-09-26: 5 mg via INTRAVENOUS
  Filled 2021-09-26: qty 2

## 2021-09-26 NOTE — ED Provider Notes (Signed)
[facility] EMERGENCY DEPARTMENT Provider Note   CSN: [id number] Arrival date & time: 09/26/21  1228     History  Chief Complaint  Patient presents with   Code Stroke    Jill Vargas is a 56 y.o. female with a history of hypercholesterolemia and prediabetes who presents to the emergency department via EMS as a code stroke with last known normal between 1030 to 11 AM this morning.  Per EMS received call from urgent care as patient presented with speech difficulty and right upper extremity weakness.  She developed right lower extremity weakness with EMS in route.  CBG 123.  Of note patient has had increased stress with loss of her brother this week.  Patient unable to provide additional history, level 5 caveat applies secondary to altered mental status.  HPI     Home Medications Prior to Admission medications   Medication Sig Start Date End Date Taking? Authorizing Provider  atorvastatin (LIPITOR) 20 MG tablet TAKE 1 TABLET BY MOUTH EVERY DAY 09/05/20   Denita Lung, MD      Allergies    Patient has no known allergies.    Review of Systems   Review of Systems  Unable to perform ROS: Mental status change    Physical Exam Updated Vital Signs BP (!) 165/95   Pulse 69   Resp 17   Wt 79.4 kg   SpO2 97%   BMI 32.02 kg/m  Physical Exam Vitals and nursing note reviewed.  Constitutional:      Appearance: She is not toxic-appearing.  HENT:     Head: Normocephalic and atraumatic.  Eyes:     Pupils: Pupils are equal, round, and reactive to light.  Cardiovascular:     Rate and Rhythm: Normal rate and regular rhythm.  Pulmonary:     Effort: Pulmonary effort is normal.     Breath sounds: Normal breath sounds.  Abdominal:     General: There is no distension.     Palpations: Abdomen is soft.     Tenderness: There is no abdominal tenderness.  Musculoskeletal:     Cervical back: Neck supple. No rigidity.  Skin:    General: Skin is warm and dry.   Neurological:     Mental Status: She is alert.     Comments: Patient stuttering.  Right upper and lower extremity weakness.  No facial droop.  Psychiatric:     Comments: Anxious and tearful.     ED Results / Procedures / Treatments   Labs (all labs ordered are listed, but only abnormal results are displayed) Labs Reviewed  APTT - Abnormal; Notable for the following components:      Result Value   aPTT 20 (*)    All other components within normal limits  I-STAT CHEM 8, ED - Abnormal; Notable for the following components:   Glucose, Bld 131 (*)    Calcium, Ion 1.05 (*)    All other components within normal limits  CBG MONITORING, ED - Abnormal; Notable for the following components:   Glucose-Capillary 150 (*)    All other components within normal limits  RESP PANEL BY RT-PCR (FLU A&B, COVID) ARPGX2  PROTIME-INR  ETHANOL  CBC  DIFFERENTIAL  COMPREHENSIVE METABOLIC PANEL  RAPID URINE DRUG SCREEN, HOSP PERFORMED  URINALYSIS, ROUTINE W REFLEX MICROSCOPIC  I-STAT BETA HCG BLOOD, ED ([facility], [facility], [facility] ONLY)    EKG None  Radiology MR BRAIN WO CONTRAST  Result Date: 09/26/2021 CLINICAL DATA:  Acute neuro deficit.  Rule out stroke EXAM: MRI HEAD WITHOUT CONTRAST TECHNIQUE: Multiplanar, multiecho pulse sequences of the brain and surrounding structures were obtained without intravenous contrast. COMPARISON:  CT head 09/26/2021 FINDINGS: Brain: Negative for acute infarct. Mild white matter changes with few small white matter hyperintensities bilaterally. Negative for hemorrhage or mass. Ventricle size normal. Vascular: Normal arterial flow voids. Hypoplastic distal right vertebral artery. Skull and upper cervical spine: No focal skeletal lesion. Sinuses/Orbits: Negative Other: None IMPRESSION: Negative for acute infarct. Mild white matter changes consistent with chronic microvascular ischemia. These results were called by telephone at the time of interpretation on 09/26/2021 at 2:05 pm to provider  [facility] , who verbally acknowledged these results. Electronically Signed   By: Franchot Gallo M.D.   On: 09/26/2021 14:06   CT HEAD CODE STROKE WO CONTRAST  Result Date: 09/26/2021 CLINICAL DATA:  Code stroke.  Neuro deficit, acute, stroke suspected EXAM: CT HEAD WITHOUT CONTRAST TECHNIQUE: Contiguous axial images were obtained from the base of the skull through the vertex without intravenous contrast. RADIATION DOSE REDUCTION: This exam was performed according to the departmental dose-optimization program which includes automated exposure control, adjustment of the mA and/or kV according to patient size and/or use of iterative reconstruction technique. COMPARISON:  2017 FINDINGS: Brain: There is no acute intracranial hemorrhage, mass effect, or edema. Gray-white differentiation is preserved. There is no extra-axial fluid collection. Ventricles and sulci are within normal limits in size and configuration. Vascular: No hyperdense vessel or unexpected calcification. Skull: Calvarium is unremarkable. Sinuses/Orbits: No acute finding. Other: None. ASPECTS ([place] Stroke Program Early CT Score) - Ganglionic level infarction (caudate, lentiform nuclei, internal capsule, insula, M1-M3 cortex): 7 - Supraganglionic infarction (M4-M6 cortex): 3 Total score (0-10 with 10 being normal): 10 IMPRESSION: There is no acute intracranial hemorrhage or evidence of acute infarction. ASPECT score is 10. These results were communicated to Dr. Lorrin Goodell at 12:45 pm on 09/26/2021 by text page via the [facility] messaging system. Electronically Signed   By: Macy Mis M.D.   On: 09/26/2021 12:46    Procedures Procedures    Medications Ordered in ED Medications  diazepam (VALIUM) injection 5 mg (has no administration in time range)    ED Course/ Medical Decision Making/ A&P                           Medical Decision Making Risk Prescription drug management.   Patient presents to the ED as a code stroke with  speech changes and right-sided weakness with last known normal 10:30 AM.  On arrival patient is stuttering, she does have right upper and lower extremity weakness, there is no facial droop.  Additional history obtained:  Discussion with EMS External records viewed including: ED visit for somewhat similar with conversion disorder in the past.  EKG: Sinus rhythm  Lab Tests:  I viewed & interpreted labs including:  CBC: Mildly elevated hemoglobin CMP: Hyperkalemia, suspect hemolysis, no EKG changes, minimal total bilirubin elevation, normal Fts PT/INR: Within normal limits APTT: Minimally low Pregnancy test: Negative Ethanol level: Negative  Imaging Studies:  I ordered and viewed the following imaging, agree with radiologist impression:  CT head wo: There is no acute intracranial hemorrhage or evidence of acute infarction. ASPECT score is 10. MRI brain wo: Negative for acute infarct. Mild white matter changes consistent with chronic microvascular ischemia.   ED Course:  01:00: Patient tearful, shouting some, will give Valium IV per discussion w/ attending.   On reassessment  following Valium patient is feeling much better, her speech is now fluent, she states she is not sure what happened but that the medicine seem to really help her and that she has been very stressed.  She is having some improved function of her right upper and lower extremity.  We will continue to monitor.\  Discussed with neurologist Dr. Koren Shiver grief reaction, discussed with family who are in agreement, no further inpatient neurological work-up recommended.  03:15: RE-EVAL: Patient with good strength and sensation throughout, ambulatory with steady gait- no foot drop, tolerating p.o. without difficulty.  She states she feels much better.  We discussed the option of TTS consultation, she declines this, she denies SI, HI, or hallucinations and does not appear acutely psychotic therefore feel that this is  reasonable.  Feel she is overall reasonable for discharge, will provide as needed Atarax, with recommendation for behavioral health and primary care follow-up.  Patient's daughter is at bedside throughout ED visit.  I discussed results, treatment plan, need for follow-up, and return precautions with the patient and her daughters provided opportunity for questions they have confirmed understanding and are in agreement.  Based on patient's chief complaint, I considered admission might be necessary, however after reassuring ED workup feel patient is reasonable for discharge.   Findings and plan of care discussed with supervising physician Dr. Doren Custard who has evaluated the patient & is in agreement.   Portions of this note were generated with [employer]. Dictation errors may occur despite best attempts at proofreading.   Final Clinical Impression(s) / ED Diagnoses Final diagnoses:  Stress    Rx / DC Orders ED Discharge Orders          Ordered    hydrOXYzine (ATARAX) 25 MG tablet  Every 8 hours PRN        09/26/21 1521              Mashelle Busick, Glynda Jaeger, PA-C 09/26/21 1532    Godfrey Pick, MD 09/26/21 1645

## 2021-09-26 NOTE — Code Documentation (Signed)
Stroke Response Nurse Documentation Code Documentation  Ainara Eldridge is a 56 y.o. female arriving to [facility]  via [facility] on 09/26/21 with past medical hx of HLD. On No antithrombotic. Code stroke was activated by ED.   Patient from home where she was LKW at 1030 when she started experiencing severe stuttering and right sided weakness. Her family states that they were on the phone with her when this happened- they picked her up and took her to Urgent Care where she had a brief period of being able to speak clearly. Urgent Care suggested she come to the Emergency Room via EMS. They also endorse that she lost her brother unexpectedly 2 days ago, has been extremely stressed, and unable to sleep since.    Stroke team at the bedside on patient arrival. Labs drawn and patient cleared for CT by EDP. Patient to CT with team. NIHSS 10, see documentation for details and code stroke times. Patient with right arm weakness, Expressive aphasia , and Sensory  neglect on exam. The following imaging was completed:  CT Head and MRI. Patient is not a candidate for IV Thrombolytic due to assumed to be stress related. Patient is not a candidate for IR due to no LVO suspected.   Care Plan: Q30 NIHSS and VS until MRI complete.   Bedside handoff with ED RN Cheryl Flash.    Meda Klinefelter  Stroke Response RN

## 2021-09-26 NOTE — Discharge Instructions (Signed)
You were seen in the emergency department today for trouble with your speech and right-sided weakness.  Your MRI did not show stroke.  Your blood work was overall reassuring.  Your blood pressure is mildly elevated, please have this rechecked by your primary care provider closely.  Sending home with Atarax to take every 8 hours as needed for stress/anxiety.  Please follow-up with the behavioral health urgent care, their walk-in hours are 8 AM to 11 AM.   We have prescribed you new medication(s) today. Discuss the medications prescribed today with your pharmacist as they can have adverse effects and interactions with your other medicines including over the counter and prescribed medications. Seek medical evaluation if you start to experience new or abnormal symptoms after taking one of these medicines, seek care immediately if you start to experience difficulty breathing, feeling of your throat closing, facial swelling, or rash as these could be indications of a more serious allergic reaction   Please follow-up with behavioral health as well as your primary care provider as soon as possible.  Return to the emergency department for any new or worsening symptoms including but not limited to new or worsening pain, trouble with your speech, numbness, weakness, passing out, chest pain, or any other concerns.

## 2021-09-26 NOTE — Consult Note (Addendum)
Neurology Consultation  Reason for Consult: Code stroke  Referring Physician: Dr. Doren Custard  CC: Right side weakness and stuttering speech   History is obtained from:medical record, and daughter at the bedside   HPI: Jill Vargas is a 56 y.o. female with past medical history of HLD, Vit d deficiency, chronic back pain and obesity who presents via EMS for right hand weakness and slurred, with worsening symptoms to include right arm and leg weakness along with stuttering speech en route to hospital. Code stroke was activated by EMS. LKW 1030. NIHSS 10. CT Head with no acute process. Patient appears anxious. Per daughter she has been under a lot of stress as her brother just passed away 2 days ago.  ON exam she is awake and alert, stuttering sounds/speech, follows commands with right arm antigravity with drift and right leg weakness  LKW: 1030 IV thrombolysis given?: not offered, suspect this is functional neurological deficit rather than a stroke. Premorbid modified Rankin scale (mRS):  0-Completely asymptomatic and back to baseline post-stroke  ROS: Full ROS was performed and is negative except as noted in the HPI.    Past Medical History:  Diagnosis Date   Chronic back pain    history related to cyst/fibroids   Chronic pelvic pain in female    Constipation    history   COVID-19 virus infection    Ectopic pregnancy    surgical removal   Fibroid uterus    High cholesterol    Hydrosalpinx 10/2013   left   Hyperlipemia    diet controlled - no meds   Obesity (BMI 30-39.9)    SVD (spontaneous vaginal delivery)    x 3   Vitamin D deficiency 11/14/2017     Family History  Problem Relation Age of Onset   Hypertension Mother    Diabetes Father      Social History:   reports that she has never smoked. She has never used smokeless tobacco. She reports that she does not currently use alcohol. She reports that she does not currently use drugs.  Medications No current  facility-administered medications for this encounter.  Current Outpatient Medications:    atorvastatin (LIPITOR) 20 MG tablet, TAKE 1 TABLET BY MOUTH EVERY DAY, Disp: 90 tablet, Rfl: 0   Exam: Current vital signs: Wt 79.4 kg   BMI 32.02 kg/m  Vital signs in last 24 hours: Weight:  [79.4 kg] 79.4 kg (07/05 1200)  GENERAL: Awake, alert in NAD HEENT: - Normocephalic and atraumatic, dry mm LUNGS - Clear to auscultation bilaterally with no wheezes CV - S1S2 RRR, no m/r/g, equal pulses bilaterally. ABDOMEN - Soft, nontender, nondistended with normoactive BS Ext: warm, well perfused, intact peripheral pulses, no edema  NEURO:  Mental Status: AA&Ox2. She is able to get a few words out, most attempts stuttering sounds.  Language: speech is minimal stuttering.   Cranial Nerves: PERRL EOMI, visual fields full, no facial asymmetry, facial sensation intact, hearing intact, tongue/uvula/soft palate midline, normal sternocleidomastoid and trapezius muscle strength. No evidence of tongue atrophy or fibrillations Motor: left arm 4/5, left leg 5/5, Right arm 3/5, right leg 3/5 Tone: is normal and bulk is normal Sensation- decreased on the right  Coordination: FTN intact bilaterally, no ataxia in BLE. Gait- deferred  NIHSS 1a Level of Conscious.: 0 1b LOC Questions: 2 1c LOC Commands: 0 2 Best Gaze: 0 3 Visual: 0 4 Facial Palsy: 0 5a Motor Arm - left: 1 5b Motor Arm - Right: 2 6a Motor Leg -  Left: 0 6b Motor Leg - Right: 2 7 Limb Ataxia: 0 8 Sensory: 1 9 Best Language: 1 10 Dysarthria: 0 11 Extinct. and Inatten.: 1 TOTAL: 10   Imaging I have reviewed the images obtained:  Code stroke CT-head There is no acute intracranial hemorrhage or evidence of acute infarction. ASPECT score is 10.  Assessment:   Jill Vargas is a 55 y.o. female with past medical history of HLD, Vit d deficiency, chronic back pain and obesity who presents via EMS for right hand weakness and slurred, with  worsening symptoms to include right arm and leg weakness along with stuttering speech en route to hospital. Code stroke was activated by EMS. LKW 1030. NIHSS 10. CT Head with no acute process. Patient appears anxious. Per daughter she has been under a lot of stress as her brother just passed away 2 days ago.    Recommendations: - MRI Brain to r/o stroke. No further workup if MRI is negative. - Frequent neuro checks until MRI  NEUROHOSPITALIST ADDENDUM Performed a face to face diagnostic evaluation.   I have reviewed the contents of history and physical exam as documented by PA/ARNP/Resident and agree with above documentation.  I have discussed and formulated the above plan as documented. Edits to the note have been made as needed.  Impression/Key exam findings/Plan: 74fp/w stuttering speech and RUE and RLE weakness. Exam with inconsistencies and seems to be able to move her RUE and RLE when she is not paying attention. Also speech seems to improve a little when she is not paying attention. High suspicion for functional neurological deficit rather than stroke. Hold off on tNAKSE and will get MRI Brain STAT. Seems to have had a similar presentation back in 2017 in the setting of a headache. She denies headache at this time.  MRI Brain reviewed and is negative for an acute stroke. Likely this is grief reaction and functional neurological disorder presenting with weakness.  I spoke to patient and daughters about the diagnosis at bedside and they agree that this is probably reaction to her brother passing away 2 days ago. Her speech is now significantly improved and her weakness is improving.  Will refer her to psychotherapy for outpatient psychotherapy and CBT.  No further inpatient neurological workup. We will signoff.  SDonnetta Simpers MD [facility] [phone]  If 7pm to 7am, please call on call as listed on AMION.

## 2021-09-26 NOTE — ED Triage Notes (Signed)
Pt BIB [facility] as code stroke. LKN was estimated to be between 1030-1100 & had slurred speech with Right hand weakness. EMS reports while en route to ED she developed Rt arm & leg weakness & speech remained mumbled. A/Ox4,  denies any HA or other symptoms. Initial BP 200/100 went down to 152/100, CBG 120, 70 bpm, bilateral 18g PIV in the AC's.

## 2021-09-26 NOTE — ED Notes (Signed)
Patient transported to MRI with RN
